# Patient Record
Sex: Female | Born: 1970 | Race: White | Hispanic: No | Marital: Married | Smoking: Never smoker
Health system: Southern US, Community
[De-identification: ages and names within clinical notes are randomized; demographics above are authoritative.]

## PROBLEM LIST (undated history)

## (undated) DIAGNOSIS — F32A Depression, unspecified: Secondary | ICD-10-CM

## (undated) DIAGNOSIS — I10 Essential (primary) hypertension: Secondary | ICD-10-CM

## (undated) DIAGNOSIS — Z803 Family history of malignant neoplasm of breast: Secondary | ICD-10-CM

## (undated) DIAGNOSIS — T7840XA Allergy, unspecified, initial encounter: Secondary | ICD-10-CM

## (undated) DIAGNOSIS — J45909 Unspecified asthma, uncomplicated: Secondary | ICD-10-CM

## (undated) DIAGNOSIS — Z9189 Other specified personal risk factors, not elsewhere classified: Secondary | ICD-10-CM

## (undated) DIAGNOSIS — F329 Major depressive disorder, single episode, unspecified: Secondary | ICD-10-CM

## (undated) DIAGNOSIS — Z8041 Family history of malignant neoplasm of ovary: Secondary | ICD-10-CM

## (undated) DIAGNOSIS — M722 Plantar fascial fibromatosis: Secondary | ICD-10-CM

## (undated) DIAGNOSIS — H409 Unspecified glaucoma: Secondary | ICD-10-CM

## (undated) DIAGNOSIS — Z1371 Encounter for nonprocreative screening for genetic disease carrier status: Secondary | ICD-10-CM

## (undated) HISTORY — DX: Family history of malignant neoplasm of ovary: Z80.41

## (undated) HISTORY — DX: Encounter for nonprocreative screening for genetic disease carrier status: Z13.71

## (undated) HISTORY — DX: Plantar fascial fibromatosis: M72.2

## (undated) HISTORY — DX: Essential (primary) hypertension: I10

## (undated) HISTORY — DX: Major depressive disorder, single episode, unspecified: F32.9

## (undated) HISTORY — PX: LYMPH NODE BIOPSY: SHX201

## (undated) HISTORY — DX: Depression, unspecified: F32.A

## (undated) HISTORY — DX: Unspecified asthma, uncomplicated: J45.909

## (undated) HISTORY — DX: Family history of malignant neoplasm of breast: Z80.3

## (undated) HISTORY — DX: Allergy, unspecified, initial encounter: T78.40XA

## (undated) HISTORY — DX: Unspecified glaucoma: H40.9

## (undated) HISTORY — DX: Other specified personal risk factors, not elsewhere classified: Z91.89

---

## 2001-05-20 ENCOUNTER — Encounter: Payer: Self-pay | Admitting: Otolaryngology

## 2001-05-20 ENCOUNTER — Encounter: Admission: RE | Admit: 2001-05-20 | Discharge: 2001-05-20 | Payer: Self-pay | Admitting: Otolaryngology

## 2007-02-17 ENCOUNTER — Ambulatory Visit: Payer: Self-pay | Admitting: Obstetrics & Gynecology

## 2007-02-23 ENCOUNTER — Ambulatory Visit: Payer: Self-pay | Admitting: Obstetrics & Gynecology

## 2008-02-23 ENCOUNTER — Ambulatory Visit: Payer: Self-pay | Admitting: Obstetrics & Gynecology

## 2010-12-21 DIAGNOSIS — Z1371 Encounter for nonprocreative screening for genetic disease carrier status: Secondary | ICD-10-CM

## 2010-12-21 HISTORY — DX: Encounter for nonprocreative screening for genetic disease carrier status: Z13.71

## 2011-07-21 ENCOUNTER — Ambulatory Visit: Payer: Self-pay | Admitting: Obstetrics & Gynecology

## 2015-01-23 ENCOUNTER — Ambulatory Visit: Payer: Self-pay | Admitting: Internal Medicine

## 2015-02-15 ENCOUNTER — Ambulatory Visit: Payer: Self-pay | Admitting: Otolaryngology

## 2015-04-15 LAB — CYTOLOGY - NON PAP

## 2015-08-29 ENCOUNTER — Other Ambulatory Visit: Payer: Self-pay | Admitting: Internal Medicine

## 2015-08-29 DIAGNOSIS — Z1231 Encounter for screening mammogram for malignant neoplasm of breast: Secondary | ICD-10-CM

## 2015-09-20 ENCOUNTER — Ambulatory Visit
Admission: RE | Admit: 2015-09-20 | Discharge: 2015-09-20 | Disposition: A | Discharge status: Home / Self Care | Payer: 59 | Source: Ambulatory Visit | Attending: Internal Medicine | Admitting: Internal Medicine

## 2015-09-20 DIAGNOSIS — Z1231 Encounter for screening mammogram for malignant neoplasm of breast: Secondary | ICD-10-CM | POA: Diagnosis present

## 2016-08-06 ENCOUNTER — Other Ambulatory Visit: Payer: Self-pay | Admitting: Obstetrics & Gynecology

## 2016-08-06 DIAGNOSIS — Z1231 Encounter for screening mammogram for malignant neoplasm of breast: Secondary | ICD-10-CM

## 2016-09-25 ENCOUNTER — Ambulatory Visit
Admission: RE | Admit: 2016-09-25 | Discharge: 2016-09-25 | Disposition: A | Discharge status: Home / Self Care | Payer: 59 | Source: Ambulatory Visit | Attending: Obstetrics & Gynecology | Admitting: Obstetrics & Gynecology

## 2016-09-25 DIAGNOSIS — Z1231 Encounter for screening mammogram for malignant neoplasm of breast: Secondary | ICD-10-CM | POA: Insufficient documentation

## 2016-11-23 ENCOUNTER — Encounter: Payer: Self-pay | Admitting: Podiatry

## 2016-11-23 ENCOUNTER — Ambulatory Visit (INDEPENDENT_AMBULATORY_CARE_PROVIDER_SITE_OTHER): Payer: 59 | Admitting: Podiatry

## 2016-11-23 DIAGNOSIS — J45909 Unspecified asthma, uncomplicated: Secondary | ICD-10-CM | POA: Insufficient documentation

## 2016-11-23 DIAGNOSIS — K219 Gastro-esophageal reflux disease without esophagitis: Secondary | ICD-10-CM | POA: Insufficient documentation

## 2016-11-23 DIAGNOSIS — L603 Nail dystrophy: Secondary | ICD-10-CM

## 2016-11-23 DIAGNOSIS — J309 Allergic rhinitis, unspecified: Secondary | ICD-10-CM | POA: Insufficient documentation

## 2016-11-23 DIAGNOSIS — R590 Localized enlarged lymph nodes: Secondary | ICD-10-CM | POA: Insufficient documentation

## 2016-11-23 NOTE — Progress Notes (Signed)
She presents today with chief complaint of discoloration to the tips of the toenails hallux bilaterally. She states that when she removed pain on there for some time she knows the tips of the toes were quite and some discoloration of the nail bed. It only seems to be occurring on the hallux nails she denies any rashes. She does relate that she has eczematous dermatitis seasonally.  Objective: Vital signs are stable alert and oriented 3 pulses are palpable. Neurologic symptoms intact. Degenerative flexor intact. Muscle strength +5 over 5 bilateral. Orthopedic evaluation of his result joints distal to the ankle range of motion without crepitation. Cutaneous evaluation Mr. is a well-hydrated cutis no erythema edema cellulitis drainage or odor. She does have discoloration with a whitening of her distal aspects of the nails. There are no tips there does appear to be some oil spots.  Assessment: Nail dystrophy cannot rule out onychomycosis without test.  Plan: Samples of the nail and skin were sent for pathologic evaluation today expecting to reveal nail dystrophy.

## 2016-12-18 ENCOUNTER — Encounter: Payer: Self-pay | Admitting: Podiatry

## 2016-12-28 ENCOUNTER — Telehealth: Payer: Self-pay | Admitting: *Deleted

## 2016-12-28 NOTE — Telephone Encounter (Addendum)
-----   Message from Garrel Ridgel, [place] sent at 12/22/2016  7:20 AM EST ----- Negative for fungus.  Chronic micro trauma. 12/28/2016-Left message informing pt to call for results.

## 2017-08-02 ENCOUNTER — Ambulatory Visit (INDEPENDENT_AMBULATORY_CARE_PROVIDER_SITE_OTHER): Payer: Managed Care, Other (non HMO) | Admitting: Podiatry

## 2017-08-02 ENCOUNTER — Ambulatory Visit (INDEPENDENT_AMBULATORY_CARE_PROVIDER_SITE_OTHER): Payer: Managed Care, Other (non HMO)

## 2017-08-02 ENCOUNTER — Encounter: Payer: Self-pay | Admitting: Podiatry

## 2017-08-02 DIAGNOSIS — M722 Plantar fascial fibromatosis: Secondary | ICD-10-CM

## 2017-08-02 MED ORDER — MELOXICAM 15 MG PO TABS: 15.0000 mg | ORAL_TABLET | Freq: Every day | ORAL | 3 refills | Status: DC

## 2017-08-02 MED ORDER — METHYLPREDNISOLONE 4 MG PO TBPK: ORAL_TABLET | ORAL | 0 refills | Status: DC

## 2017-08-02 NOTE — Patient Instructions (Addendum)

## 2017-08-02 NOTE — Progress Notes (Signed)
She presents today chief complaint of plantar heel pain right. Systematic heels and hurting for the past few months I think hiking last week just really to get over the edge.  Objective: Vital signs are stable alert and oriented 3. Pulses are palpable. Neurologic sensorium is intact. Deep tendon reflexes are intact. Muscle strength is 5 over 5 dorsiflexion plantar flexors and inverters everters on physical musculatures intact. Orthopedic evaluation strength all joints distal to the ankle range of motion without crepitation. He has pain on palpation medially located tubercle of the right heel.  Radius segment and they demonstrate plantar distally or [place] soft tissue increase in density for fascial cancer site.  Assessment: Pain limb secondary to plantar fascitis right foot.  Plan: Start her on a Medrol Dosepak to be followed by meloxicam. Injected the right heel today with Kenalog and local anesthetic. Plantar fascia brace and night splint. Discussed proper shoe yesterday size ice therapy and shoe gear modifications. Follow up with her in 1 month.

## 2017-08-20 ENCOUNTER — Other Ambulatory Visit: Payer: Self-pay | Admitting: Obstetrics & Gynecology

## 2017-08-20 DIAGNOSIS — Z1231 Encounter for screening mammogram for malignant neoplasm of breast: Secondary | ICD-10-CM

## 2017-08-30 ENCOUNTER — Ambulatory Visit: Payer: Managed Care, Other (non HMO) | Admitting: Podiatry

## 2017-09-13 ENCOUNTER — Encounter: Payer: Self-pay | Admitting: Podiatry

## 2017-09-13 ENCOUNTER — Ambulatory Visit (INDEPENDENT_AMBULATORY_CARE_PROVIDER_SITE_OTHER): Payer: Managed Care, Other (non HMO) | Admitting: Podiatry

## 2017-09-13 DIAGNOSIS — M722 Plantar fascial fibromatosis: Secondary | ICD-10-CM | POA: Diagnosis not present

## 2017-09-13 MED ORDER — DICLOFENAC SODIUM 75 MG PO TBEC: 75.0000 mg | DELAYED_RELEASE_TABLET | Freq: Two times a day (BID) | ORAL | 1 refills | Status: DC

## 2017-09-13 NOTE — Progress Notes (Signed)
She presents today for follow-up of her plantar fasciitis right heel. She states that she doesn't seem to think that the meloxicam is doing too much and she states that after about 2 weeks it started to hurt again. She states that she may not have been wearing the brace away she should've been wearing it and she deathly not wearing tennis shoes as appropriately.  Objective: Vital signs are stable she is alert and oriented 3. Pulses are palpable. She fell palpation medial calcaneal tubercle of the right heel.  Assessment: Chronic intractable plantar fascitis right foot.  Plan: Injected the right heel once again today switched her from meloxicam to diclofenac 75 mg 1 by mouth twice a day I will follow up with her in 1 month may need to consider orthotics.

## 2017-09-27 ENCOUNTER — Ambulatory Visit
Admission: RE | Admit: 2017-09-27 | Discharge: 2017-09-27 | Disposition: A | Discharge status: Home / Self Care | Payer: Managed Care, Other (non HMO) | Source: Ambulatory Visit | Attending: Obstetrics & Gynecology | Admitting: Obstetrics & Gynecology

## 2017-09-27 ENCOUNTER — Encounter: Payer: Self-pay | Admitting: Obstetrics & Gynecology

## 2017-09-27 DIAGNOSIS — Z1231 Encounter for screening mammogram for malignant neoplasm of breast: Secondary | ICD-10-CM | POA: Insufficient documentation

## 2017-10-06 ENCOUNTER — Other Ambulatory Visit: Payer: Self-pay | Admitting: *Deleted

## 2017-10-06 MED ORDER — MELOXICAM 15 MG PO TABS: 15.0000 mg | ORAL_TABLET | Freq: Every day | ORAL | 1 refills | Status: DC

## 2017-10-11 ENCOUNTER — Ambulatory Visit: Payer: Managed Care, Other (non HMO) | Admitting: Podiatry

## 2017-11-15 ENCOUNTER — Ambulatory Visit: Payer: Managed Care, Other (non HMO) | Admitting: Obstetrics and Gynecology

## 2017-12-08 ENCOUNTER — Ambulatory Visit: Payer: Managed Care, Other (non HMO) | Admitting: Obstetrics and Gynecology

## 2018-01-05 ENCOUNTER — Ambulatory Visit: Payer: Managed Care, Other (non HMO) | Admitting: Obstetrics and Gynecology

## 2018-01-07 ENCOUNTER — Other Ambulatory Visit: Payer: Self-pay | Admitting: Obstetrics & Gynecology

## 2018-01-21 DIAGNOSIS — Z9189 Other specified personal risk factors, not elsewhere classified: Secondary | ICD-10-CM

## 2018-01-21 HISTORY — DX: Other specified personal risk factors, not elsewhere classified: Z91.89

## 2018-01-28 ENCOUNTER — Encounter: Payer: Self-pay | Admitting: Obstetrics and Gynecology

## 2018-01-28 ENCOUNTER — Ambulatory Visit (INDEPENDENT_AMBULATORY_CARE_PROVIDER_SITE_OTHER): Payer: Managed Care, Other (non HMO) | Admitting: Obstetrics and Gynecology

## 2018-01-28 VITALS — BP 134/90 | HR 78 | Ht 66.0 in | Wt 177.0 lb

## 2018-01-28 DIAGNOSIS — Z01419 Encounter for gynecological examination (general) (routine) without abnormal findings: Secondary | ICD-10-CM

## 2018-01-28 DIAGNOSIS — Z803 Family history of malignant neoplasm of breast: Secondary | ICD-10-CM

## 2018-01-28 DIAGNOSIS — Z1321 Encounter for screening for nutritional disorder: Secondary | ICD-10-CM

## 2018-01-28 DIAGNOSIS — Z3041 Encounter for surveillance of contraceptive pills: Secondary | ICD-10-CM | POA: Diagnosis not present

## 2018-01-28 DIAGNOSIS — Z1239 Encounter for other screening for malignant neoplasm of breast: Secondary | ICD-10-CM

## 2018-01-28 DIAGNOSIS — Z1231 Encounter for screening mammogram for malignant neoplasm of breast: Secondary | ICD-10-CM

## 2018-01-28 MED ORDER — LEVONORGEST-ETH ESTRAD 91-DAY 0.15-0.03 &0.01 MG PO TABS: 1.0000 | ORAL_TABLET | Freq: Every day | ORAL | 3 refills | Status: DC

## 2018-01-28 NOTE — Progress Notes (Signed)
PCP:  Adin Hector, MD   Chief Complaint  Patient presents with  . Gynecologic Exam    curiosity about be middle aged women, night sweats and such     HPI:      [name] is a 47 y.o. No obstetric history on file. who LMP was No LMP recorded (lmp unknown)., presents today for her annual examination.  Her menses are mostly absent with OCPs, occas spotting. Dysmenorrhea none. She does have occas night sweats.  Sex activity: not sexually active--husband with current health issues.  Last Pap: November 09, 2016  Results were: no abnormalities /neg HPV DNA  Hx of STDs: none  Last mammogram: 09/27/17  Results were: normal--routine follow-up in 12 months There is a FH of breast cancer in her mom and pat aunt. There is a FH of ovarian cancer in her pat aunt, and colon cancer in her pat uncle. The patient does do self-breast exams. BRCA neg 6/12. No update testing done. No Vit D supp.  Tobacco use: The patient denies current or previous tobacco use. Alcohol use: none No drug use.  Exercise: moderately active  She does get adequate calcium but not Vitamin D in her diet.  Labs with PCP   Past Medical History:  Diagnosis Date  . Asthma   . BRCA negative 2012  . Family history of breast cancer   . Family history of ovarian cancer    6/12 BRCA Neg  . Plantar fasciitis     No past surgical history on file.  Family History  Problem Relation Age of Onset  . Breast cancer Mother 27  . Breast cancer Paternal Aunt 49  . Colon cancer Paternal Uncle 11  . Ovarian cancer Paternal Aunt 45    Social History   Socioeconomic History  . Marital status: Married    Spouse name: Not on file  . Number of children: Not on file  . Years of education: Not on file  . Highest education level: Not on file  Social Needs  . Financial resource strain: Not on file  . Food insecurity - worry: Not on file  . Food insecurity - inability: Not on file  . Transportation needs -  medical: Not on file  . Transportation needs - non-medical: Not on file  Occupational History  . Not on file  Tobacco Use  . Smoking status: Not on file  . Smokeless tobacco: Never Used  Substance and Sexual Activity  . Alcohol use: No    Frequency: Never  . Drug use: No  . Sexual activity: No    Birth control/protection: Pill  Other Topics Concern  . Not on file  Social History Narrative  . Not on file    Current Meds  Medication Sig  . ADVAIR DISKUS 250-50 MCG/DOSE AEPB   . albuterol (PROVENTIL HFA;VENTOLIN HFA) 108 (90 Base) MCG/ACT inhaler Inhale into the lungs.  . cetirizine (ZYRTEC ALLERGY) 10 MG tablet Take 1 tablet by mouth daily.  . Fluticasone-Salmeterol (ADVAIR DISKUS) 250-50 MCG/DOSE AEPB Inhale into the lungs.  . Levonorgestrel-Ethinyl Estradiol (DAYSEE) 0.15-0.03 &0.01 MG tablet Take 1 tablet by mouth daily.  . montelukast (SINGULAIR) 10 MG tablet   . [DISCONTINUED] Levonorgestrel-Ethinyl Estradiol (DAYSEE) 0.15-0.03 &0.01 MG tablet Take 1 tablet by mouth daily.     ROS:  Review of Systems  Constitutional: Negative for fatigue, fever and unexpected weight change.  Respiratory: Negative for cough, shortness of breath and wheezing.   Cardiovascular: Negative for  chest pain, palpitations and leg swelling.  Gastrointestinal: Negative for blood in stool, constipation, diarrhea, nausea and vomiting.  Endocrine: Negative for cold intolerance, heat intolerance and polyuria.  Genitourinary: Negative for dyspareunia, dysuria, flank pain, frequency, genital sores, hematuria, menstrual problem, pelvic pain, urgency, vaginal bleeding, vaginal discharge and vaginal pain.  Musculoskeletal: Negative for Harrell pain, joint swelling and myalgias.  Skin: Negative for rash.  Neurological: Negative for dizziness, syncope, light-headedness, numbness and headaches.  Hematological: Negative for adenopathy.  Psychiatric/Behavioral: Negative for agitation, confusion, sleep disturbance  and suicidal ideas. The patient is not nervous/anxious.      Objective: BP 134/90   Pulse 78   Ht '5\' 6"'  (1.676 m)   Wt 177 lb (80.3 kg)   LMP  (LMP Unknown) Comment: cont. bc  BMI 28.57 kg/m    Physical Exam  Constitutional: She is oriented to person, place, and time. She appears well-developed and well-nourished.  Genitourinary: Vagina normal and uterus normal. There is no rash or tenderness on the right labia. There is no rash or tenderness on the left labia. No erythema or tenderness in the vagina. No vaginal discharge found. Right adnexum does not display mass and does not display tenderness. Left adnexum does not display mass and does not display tenderness. Cervix does not exhibit motion tenderness or polyp. Uterus is not enlarged or tender.  Neck: Normal range of motion. No thyromegaly present.  Cardiovascular: Normal rate, regular rhythm and normal heart sounds.  No murmur heard. Pulmonary/Chest: Effort normal and breath sounds normal. Right breast exhibits no mass, no nipple discharge, no skin change and no tenderness. Left breast exhibits no mass, no nipple discharge, no skin change and no tenderness.  Abdominal: Soft. There is no tenderness. There is no guarding.  Musculoskeletal: Normal range of motion.  Neurological: She is alert and oriented to person, place, and time. No cranial nerve deficit.  Psychiatric: She has a normal mood and affect. Her behavior is normal.  Vitals reviewed.   Assessment/Plan: Encounter for annual routine gynecological examination  Screening for breast cancer - Pt to sched mammo 10/19 - Plan: MM SCREENING BREAST TOMO BILATERAL  Family history of breast cancer - MyRisk update testing discussed and done today. Will call pt with results since [employer]. - Plan: MM SCREENING BREAST TOMO BILATERAL, Integrated BRACAnalysis ([place])  Encounter for surveillance of contraceptive pills - OCP RF.  - Plan: Levonorgestrel-Ethinyl Estradiol  (DAYSEE) 0.15-0.03 &0.01 MG tablet  Encounter for vitamin deficiency screening - Plan: VITAMIN D 25 Hydroxy (Vit-D Deficiency, Fractures)  Meds ordered this encounter  Medications  . Levonorgestrel-Ethinyl Estradiol (DAYSEE) 0.15-0.03 &0.01 MG tablet    Sig: Take 1 tablet by mouth daily.    Dispense:  84 tablet    Refill:  3    Order Specific Question:   Supervising Provider    Answer:   Gae Dry [[id number]]             GYN counsel breast self exam, mammography screening, menopause, adequate intake of calcium and vitamin D, diet and exercise     F/U  Return in about 1 year (around 01/28/2019).  Alicia B. Copland, PA-C 01/28/2018 9:10 AM

## 2018-01-28 NOTE — Patient Instructions (Signed)
I value your feedback and entrusting us with your care. If you get a  patient survey, I would appreciate you taking the time to let us know about your experience today. Thank you! 

## 2018-01-29 LAB — VITAMIN D 25 HYDROXY (VIT D DEFICIENCY, FRACTURES): VIT D 25 HYDROXY: 18.9 ng/mL — AB (ref 30.0–100.0)

## 2018-02-07 ENCOUNTER — Encounter: Payer: Self-pay | Admitting: Obstetrics and Gynecology

## 2018-03-01 ENCOUNTER — Telehealth: Payer: Self-pay | Admitting: Obstetrics and Gynecology

## 2018-03-01 NOTE — Telephone Encounter (Signed)
Discussed MyRisk resutls. Pt already talked with GC, Stefanie Libel, due to [employer]. Pt's riskscore=34.5%/IBIS=21%. Pt's last mammo 10/18 was normal. Doing SBE, Vit D supp. Had CBE 2/19.  Discussed monthly SBE, Q6-12 month CBE, yearly mammos and scr br MRI. Pt can't afford MRI this yr but will consider next yr. Will do mammo 10/19 and f/u for 2/20 annual. Will re-discuss scr breast MRI at that time.  Pt's questions answered. She has hard copy of results through [facility]. F/u prn.

## 2018-08-09 LAB — LIPID PANEL
Cholesterol: 210 — AB (ref 0–200)
HDL: 50 (ref 35–70)
LDL CALC: 134
Triglycerides: 131 (ref 40–160)

## 2018-08-09 LAB — BASIC METABOLIC PANEL: GLUCOSE: 90

## 2018-08-09 LAB — HEMOGLOBIN A1C: Hemoglobin A1C: 5.6

## 2018-09-09 ENCOUNTER — Encounter: Payer: Self-pay | Admitting: Family Medicine

## 2018-09-09 ENCOUNTER — Ambulatory Visit: Payer: Managed Care, Other (non HMO) | Admitting: Family Medicine

## 2018-09-09 VITALS — BP 142/80 | HR 82 | Temp 98.3°F | Resp 16 | Ht 66.0 in | Wt 176.0 lb

## 2018-09-09 DIAGNOSIS — R5383 Other fatigue: Secondary | ICD-10-CM

## 2018-09-09 DIAGNOSIS — R030 Elevated blood-pressure reading, without diagnosis of hypertension: Secondary | ICD-10-CM | POA: Diagnosis not present

## 2018-09-09 DIAGNOSIS — Z23 Encounter for immunization: Secondary | ICD-10-CM | POA: Diagnosis not present

## 2018-09-09 DIAGNOSIS — Z7689 Persons encountering health services in other specified circumstances: Secondary | ICD-10-CM | POA: Diagnosis not present

## 2018-09-09 NOTE — Progress Notes (Signed)
Subjective:    Patient ID: Nicole Harrell, female    DOB: May 24, 1971, 47 y.o.   MRN: [id number]  HPI This is a 47 yo female who presents today to establish care. Previously seen by Dr. Caryl Comes at [facility]. Also sees Ardeth Perfect, [place] for gynecologic care.  Her mother passed (age 40) away from unknown cause 3/19. She is now taking care of her special needs brother (he recently moved to a supervised apartment). Mother with history of asthma. ? Early lung cancer.   Lives with husband and 18 yo daughter. She works for [employer] for 24 years. Some stress with work. Has gained weight, doesn't feel as good.   Has had genetic screening with genetic counselor due to family history. She is interested in modifiable risk factors.   Last CPE- gyn 2/19 Mammo-09/27/17 Pap- 11/09/16, negative, negative HPV Tdap- 01/09/15 Flu- annual Exercise- really enjoys yoga, little time for exercise lately Sleep- 6-7 hours during work week  Achy, especially feet and legs. More sedentary lately. Has history of plantar fasciitis, this feels different.   Elevated blood pressure reading. Had at work biometric screening.   Past Medical History:  Diagnosis Date  . Asthma   . BRCA negative 2012, 2/19   BRCA neg 2012; MyRIsk neg 2019  . Depression   . Family history of breast cancer   . Family history of ovarian cancer    6/12 BRCA Neg  . Increased risk of breast cancer 01/2018   IBIS=21%/riskscore=34.5%  . Plantar fasciitis    Past Surgical History:  Procedure Laterality Date  . LYMPH NODE BIOPSY     Family History  Problem Relation Age of Onset  . Breast cancer Mother 36  . Breast cancer Paternal Aunt 55  . Colon cancer Paternal Uncle 65  . Ovarian cancer Paternal Aunt 21   Social History   Tobacco Use  . Smoking status: Never Smoker  . Smokeless tobacco: Never Used  Substance Use Topics  . Alcohol use: No    Frequency: Never  . Drug use: No     Review of Systems Per HPI      Objective:   Physical Exam Physical Exam  Vitals reviewed. Constitutional: Oriented to person, place, and time. Appears well-developed and well-nourished.  HENT:  Head: Normocephalic and atraumatic.  Eyes: Conjunctivae are normal.  Neck: Normal range of motion. Neck supple.  Cardiovascular: Normal rate.   Pulmonary/Chest: Effort normal.  Musculoskeletal: Normal range of motion.  Neurological: Alert and oriented to person, place, and time.  Skin: Skin is warm and dry.  Psychiatric: Normal mood and affect. Behavior is normal. Judgment and thought content normal.      BP (!) 142/80 (BP Location: Left Arm, Patient Position: Sitting, Cuff Size: Normal)   Pulse 82   Temp 98.3 F (36.8 C) (Oral)   Resp 16   Ht '5\' 6"'  (1.676 m)   Wt 176 lb (79.8 kg)   SpO2 98%   BMI 28.41 kg/m  Wt Readings from Last 3 Encounters:  09/09/18 176 lb (79.8 kg)  01/28/18 177 lb (80.3 kg)   BP Readings from Last 3 Encounters:  09/09/18 (!) 142/80  01/28/18 134/90       Assessment & Plan:  1. Encounter to establish care -- Discussed and encouraged healthy lifestyle choices- adequate sleep, regular exercise, stress management and healthy food choices.   2. Elevated blood pressure reading - TSH - mildly elevated today in office, she is very motivated to work  on diet, provided information about Med Diet  3. Fatigue, unspecified type - multiple increased stressors, decreased time for self care - encouraged her to work on sleep quality/quantity, stress management, healthy food choices  4. Need for influenza vaccination - Flu Vaccine QUAD 6+ mos PF IM (Fluarix Quad PF)   Clarene Reamer, FNP-BC  [facility], [facility]  09/09/2018 9:15 PM

## 2018-09-09 NOTE — Patient Instructions (Addendum)
Yoga resources- You tube [place] [employer] on Demand  Follow up in 3 months     [place] refers to food and lifestyle choices that are based on the traditions of countries located on the [employer]. This way of eating has been shown to help prevent certain conditions and improve outcomes for people who have chronic diseases, like kidney disease and heart disease. What are tips for following this plan? Lifestyle  Cook and eat meals together with your family, when possible.  Drink enough fluid to keep your urine clear or pale yellow.  Be physically active every day. This includes: ? Aerobic exercise like running or swimming. ? Leisure activities like gardening, walking, or housework.  Get 7-8 hours of sleep each night.  If recommended by your health care provider, drink red wine in moderation. This means 1 glass a day for nonpregnant women and 2 glasses a day for men. A glass of wine equals 5 oz (150 mL). Reading food labels  Check the serving size of packaged foods. For foods such as rice and pasta, the serving size refers to the amount of cooked product, not dry.  Check the total fat in packaged foods. Avoid foods that have saturated fat or trans fats.  Check the ingredients list for added sugars, such as corn syrup. Shopping  At the grocery store, buy most of your food from the areas near the walls of the store. This includes: ? Fresh fruits and vegetables (produce). ? Grains, beans, nuts, and seeds. Some of these may be available in unpackaged forms or large amounts (in bulk). ? Fresh seafood. ? Poultry and eggs. ? Low-fat dairy products.  Buy whole ingredients instead of prepackaged foods.  Buy fresh fruits and vegetables in-season from local farmers markets.  Buy frozen fruits and vegetables in resealable bags.  If you do not have access to quality fresh seafood, buy precooked frozen shrimp or canned fish, such as tuna,  salmon, or sardines.  Buy small amounts of raw or cooked vegetables, salads, or olives from the deli or salad bar at your store.  Stock your pantry so you always have certain foods on hand, such as olive oil, canned tuna, canned tomatoes, rice, pasta, and beans. Cooking  Cook foods with extra-virgin olive oil instead of using butter or other vegetable oils.  Have meat as a side dish, and have vegetables or grains as your main dish. This means having meat in small portions or adding small amounts of meat to foods like pasta or stew.  Use beans or vegetables instead of meat in common dishes like chili or lasagna.  Experiment with different cooking methods. Try roasting or broiling vegetables instead of steaming or sauteing them.  Add frozen vegetables to soups, stews, pasta, or rice.  Add nuts or seeds for added healthy fat at each meal. You can add these to yogurt, salads, or vegetable dishes.  Marinate fish or vegetables using olive oil, lemon juice, garlic, and fresh herbs. Meal planning  Plan to eat 1 vegetarian meal one day each week. Try to work up to 2 vegetarian meals, if possible.  Eat seafood 2 or more times a week.  Have healthy snacks readily available, such as: ? Vegetable sticks with hummus. ? [place] yogurt. ? Fruit and nut trail mix.  Eat balanced meals throughout the week. This includes: ? Fruit: 2-3 servings a day ? Vegetables: 4-5 servings a day ? Low-fat dairy: 2 servings a day ? Fish,  poultry, or lean meat: 1 serving a day ? Beans and legumes: 2 or more servings a week ? Nuts and seeds: 1-2 servings a day ? Whole grains: 6-8 servings a day ? Extra-virgin olive oil: 3-4 servings a day  Limit red meat and sweets to only a few servings a month What are my food choices?  Mediterranean diet ? Recommended ? Grains: Whole-grain pasta. Brown rice. Bulgar wheat. Polenta. Couscous. Whole-wheat bread. [place]. ? Vegetables: Artichokes. Beets. Broccoli.  Cabbage. Carrots. Eggplant. Green beans. Chard. Kale. Spinach. Onions. Leeks. Peas. Squash. Tomatoes. Peppers. Radishes. ? Fruits: Apples. Apricots. Avocado. Berries. Bananas. Cherries. Dates. Figs. Grapes. Lemons. Melon. Oranges. Peaches. Plums. Pomegranate. ? Meats and other protein foods: Beans. Almonds. Sunflower seeds. Pine nuts. Peanuts. [place]. Salmon. [place]all[place]. Shrimp. [place]. Tilapia. Clams. Oysters. Eggs. ? Dairy: Low-fat milk. Cheese. Greek yogurt. ? Beverages: Water. Red wine. Herbal tea. ? Fats and oils: Extra virgin olive oil. Avocado oil. Grape seed oil. ? Sweets and desserts: [place] yogurt with honey. Baked apples. Poached pears. Trail mix. ? Seasoning and other foods: Basil. Cilantro. Coriander. Cumin. Mint. Parsley. Sage. Rosemary. Tarragon. Garlic. Oregano. Thyme. Pepper. Balsalmic vinegar. Tahini. Hummus. Tomato sauce. Olives. Mushrooms. ? Limit these ? Grains: Prepackaged pasta or rice dishes. Prepackaged cereal with added sugar. ? Vegetables: Deep fried potatoes (french fries). ? Fruits: Fruit canned in syrup. ? Meats and other protein foods: Beef. Pork. Lamb. Poultry with skin. Hot dogs. [place] Salines. ? Dairy: Ice cream. Sour cream. Whole milk. ? Beverages: Juice. Sugar-sweetened soft drinks. Beer. Liquor and spirits. ? Fats and oils: Butter. Canola oil. Vegetable oil. Beef fat (tallow). Lard. ? Sweets and desserts: Cookies. Cakes. Pies. Candy. ? Seasoning and other foods: Mayonnaise. Premade sauces and marinades. ? The items listed may not be a complete list. Talk with your dietitian about what dietary choices are right for you. Summary  The Mediterranean diet includes both food and lifestyle choices.  Eat a variety of fresh fruits and vegetables, beans, nuts, seeds, and whole grains.  Limit the amount of red meat and sweets that you eat.  Talk with your health care provider about whether it is safe for you to drink red wine in moderation. This means 1 glass a day for  nonpregnant women and 2 glasses a day for men. A glass of wine equals 5 oz (150 mL). This information is not intended to replace advice given to you by your health care provider. Make sure you discuss any questions you have with your health care provider. Document Released: 07/30/2016 Document Revised: 09/01/2016 Document Reviewed: 07/30/2016 Elsevier Interactive Patient Education  [employer].

## 2018-09-10 LAB — TSH: TSH: 1.23 u[IU]/mL (ref 0.450–4.500)

## 2018-09-29 ENCOUNTER — Ambulatory Visit
Admission: RE | Admit: 2018-09-29 | Discharge: 2018-09-29 | Disposition: A | Discharge status: Home / Self Care | Payer: Managed Care, Other (non HMO) | Source: Ambulatory Visit | Attending: Obstetrics and Gynecology | Admitting: Obstetrics and Gynecology

## 2018-09-29 DIAGNOSIS — Z803 Family history of malignant neoplasm of breast: Secondary | ICD-10-CM | POA: Diagnosis present

## 2018-09-29 DIAGNOSIS — Z1239 Encounter for other screening for malignant neoplasm of breast: Secondary | ICD-10-CM | POA: Insufficient documentation

## 2018-09-30 ENCOUNTER — Encounter: Payer: Self-pay | Admitting: Obstetrics and Gynecology

## 2018-11-23 ENCOUNTER — Encounter: Payer: Self-pay | Admitting: Family Medicine

## 2018-11-28 ENCOUNTER — Other Ambulatory Visit: Payer: Self-pay | Admitting: Family Medicine

## 2018-11-28 DIAGNOSIS — E663 Overweight: Secondary | ICD-10-CM

## 2018-12-09 ENCOUNTER — Ambulatory Visit: Payer: Managed Care, Other (non HMO) | Admitting: Family Medicine

## 2018-12-13 ENCOUNTER — Encounter: Payer: Managed Care, Other (non HMO) | Attending: Family Medicine | Admitting: Dietician

## 2018-12-13 ENCOUNTER — Encounter: Payer: Self-pay | Admitting: Dietician

## 2018-12-13 VITALS — Ht 66.0 in | Wt 178.4 lb

## 2018-12-13 DIAGNOSIS — Z6828 Body mass index (BMI) 28.0-28.9, adult: Secondary | ICD-10-CM | POA: Insufficient documentation

## 2018-12-13 DIAGNOSIS — Z713 Dietary counseling and surveillance: Secondary | ICD-10-CM | POA: Insufficient documentation

## 2018-12-13 DIAGNOSIS — E663 Overweight: Secondary | ICD-10-CM | POA: Insufficient documentation

## 2018-12-13 NOTE — Patient Instructions (Signed)
   Begin some regular exercise, starting with light intensity and/or short duration and gradually build up such as walking 3x a week and yoga once a week.   Measure food portions, especially starches, and reduce as needed.   Plan for balanced meals, including generous portions of low-carb veggies, lean proteins, and small-moderate portions of starches, often whole grain versions,.  Use an app like MyFitnessPal to track food intake and activity.

## 2018-12-13 NOTE — Progress Notes (Signed)
Medical Nutrition Therapy: Visit start time: 0900  end time: 1010 Assessment:  Diagnosis: overweight Past medical history: GERD Psychosocial issues/ stress concerns: high stress level; patient feels she is dealing "Ok" with stress  Preferred learning method:  . Auditory . Visual . 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 She has worked on healthy lifestyle habits in the past -- tried [employer], also incorporated exercise (running, free weights) to maintain healthy weight. She seeks help in working on healthy and balanced eating and fitness, wants to avoid fad diets; voices confusion with varying diet trends and consumer health information.    Physical activity: none at this time; plans to resume  Dietary Intake:  Usual eating pattern includes 3 meals and 1-2 snacks per day. Dining out frequency: 3-4 meals per week.  Breakfast: smoothie with almond milk, homey, juice plus protein powder, occ dark choc chips or cherries + whole grain toast with nuts or oatmeal. Drinks tea with stevia Snack: granola bar; chips, cheese-its Lunch: salad with protien ie chicken at least once daily with balsamic vin or ranch. Doesn't like soft textured fruits with seeds, does like apples, pears. Sometimes protein snack pack Snack: same as am Supper: daughter likes pastas (lemon, alfredo); often on-the-go eating mikonos, la fiesta Snack: none Beverages: water, tea somteimes forgets to drink when busy  Nutrition Care Education: Topics covered: weight control Basic nutrition: basic food groups, appropriate nutrient balance, appropriate meal and snack schedule, general nutrition guidelines    Weight control: benefits of weight control, plate method for planning balanced meals,  DASH and Mediterranean diets/ eating patterns, appropriate food portions and balance for 1300kcal intake for weight loss; disadvantages of popular diets; sustaining healthy eating pattern; role of physical activity; benefits of tracking food intake and activity  Nutritional Diagnosis:  [place]-3.3 Overweight/obesity As related to excess calories, inactivity, stress.  As evidenced by patient with current BMI of 28.8, and patient report of diet and activity history.  Intervention:   Instruction as noted above.  Patient is motivated to resume healthy eating pattern and has begun making some changes.  Encouraged gradual changes, a few at a time, to make long-lasting healthy habits  Set goals with direction from patient.  Patient does not feel she needs follow-up at this time, but will schedule later if needed.  Education Materials given:  . Plate Planner with food lists . Sample meal pattern/ menus . DASH diet overview . Goals/ instructions   Learner/ who was taught:  . Patient    Level of understanding: Marland Kitchen Verbalizes/ demonstrates competency   Demonstrated degree of understanding via:   Teach back Learning barriers: . None  Willingness to learn/ readiness for change: . Acceptance, ready for change  Monitoring and Evaluation:  Dietary intake, exercise, and body weight      follow up: prn

## 2018-12-22 ENCOUNTER — Telehealth: Payer: Managed Care, Other (non HMO) | Admitting: Family

## 2018-12-22 DIAGNOSIS — R059 Cough, unspecified: Secondary | ICD-10-CM

## 2018-12-22 DIAGNOSIS — R05 Cough: Secondary | ICD-10-CM

## 2018-12-22 DIAGNOSIS — B9789 Other viral agents as the cause of diseases classified elsewhere: Secondary | ICD-10-CM

## 2018-12-22 DIAGNOSIS — J329 Chronic sinusitis, unspecified: Secondary | ICD-10-CM

## 2018-12-22 MED ORDER — ALBUTEROL SULFATE HFA 108 (90 BASE) MCG/ACT IN AERS: 2.0000 | INHALATION_SPRAY | Freq: Four times a day (QID) | RESPIRATORY_TRACT | 2 refills | Status: DC | PRN

## 2018-12-22 MED ORDER — FLUTICASONE PROPIONATE 50 MCG/ACT NA SUSP: 1.0000 | Freq: Two times a day (BID) | NASAL | 6 refills | Status: DC

## 2018-12-22 MED ORDER — BENZONATATE 100 MG PO CAPS: 100.0000 mg | ORAL_CAPSULE | Freq: Three times a day (TID) | ORAL | 0 refills | Status: DC | PRN

## 2018-12-22 NOTE — Progress Notes (Signed)
Thank you for the details you included in the comment boxes. Those details are very helpful in determining the best course of treatment for you and help [place] to provide the best care. See plan below. At 3 days, it is too early to consider antibiotics as nearly 90% of these infections are viral. I refilled your inhaler and added Tessalon Perles 100mg  also; take 1-2 gel caps every 8 hours as needed for cough. See more details below this line.  We are sorry that you are not feeling well.  Here is how we plan to help!  Based on what you have shared with me it looks like you have sinusitis.  Sinusitis is inflammation and infection in the sinus cavities of the head.  Based on your presentation I believe you most likely have Acute Viral Sinusitis.This is an infection most likely caused by a virus. There is not specific treatment for viral sinusitis other than to help you with the symptoms until the infection runs its course.  You may use an oral decongestant such as Mucinex D or if you have glaucoma or high blood pressure use plain Mucinex. Saline nasal spray help and can safely be used as often as needed for congestion, I have prescribed: Fluticasone nasal spray one sprays in each nostril twice a day  Some authorities believe that zinc sprays or the use of Echinacea may shorten the course of your symptoms.  Sinus infections are not as easily transmitted as other respiratory infection, however we still recommend that you avoid close contact with loved ones, especially the very young and elderly.  Remember to wash your hands thoroughly throughout the day as this is the number one way to prevent the spread of infection!  Home Care:  Only take medications as instructed by your medical team.  Do not take these medications with alcohol.  A steam or ultrasonic humidifier can help congestion.  You can place a towel over your head and breathe in the steam from hot water coming from a faucet.  Avoid close contacts  especially the very young and the elderly.  Cover your mouth when you cough or sneeze.  Always remember to wash your hands.  Get Help Right Away If:  You develop worsening fever or sinus pain.  You develop a severe head ache or visual changes.  Your symptoms persist after you have completed your treatment plan.  Make sure you  Understand these instructions.  Will watch your condition.  Will get help right away if you are not doing well or get worse.  Your e-visit answers were reviewed by a board certified advanced clinical practitioner to complete your personal care plan.  Depending on the condition, your plan could have included both over the counter or prescription medications.  If there is a problem please reply  once you have received a response from your provider.  Your safety is important to [place].  If you have drug allergies check your prescription carefully.    You can use MyChart to ask questions about today's visit, request a non-urgent call back, or ask for a work or school excuse for 24 hours related to this e-Visit. If it has been greater than 24 hours you will need to follow up with your provider, or enter a new e-Visit to address those concerns.  You will get an e-mail in the next two days asking about your experience.  I hope that your e-visit has been valuable and will speed your recovery. Thank you for  using e-visits.

## 2019-01-06 ENCOUNTER — Ambulatory Visit: Payer: Managed Care, Other (non HMO) | Admitting: Family Medicine

## 2019-01-30 ENCOUNTER — Ambulatory Visit: Payer: Managed Care, Other (non HMO) | Admitting: Obstetrics and Gynecology

## 2019-02-08 ENCOUNTER — Ambulatory Visit: Payer: Managed Care, Other (non HMO) | Admitting: Obstetrics and Gynecology

## 2019-02-16 ENCOUNTER — Other Ambulatory Visit: Payer: Self-pay | Admitting: Obstetrics and Gynecology

## 2019-02-16 DIAGNOSIS — Z3041 Encounter for surveillance of contraceptive pills: Secondary | ICD-10-CM

## 2019-03-06 ENCOUNTER — Ambulatory Visit: Payer: Managed Care, Other (non HMO) | Admitting: Obstetrics and Gynecology

## 2019-03-23 ENCOUNTER — Ambulatory Visit: Payer: Managed Care, Other (non HMO) | Admitting: Obstetrics and Gynecology

## 2019-04-07 ENCOUNTER — Ambulatory Visit: Payer: Managed Care, Other (non HMO) | Admitting: Family Medicine

## 2019-04-17 ENCOUNTER — Ambulatory Visit: Payer: Managed Care, Other (non HMO) | Admitting: Obstetrics and Gynecology

## 2019-05-05 ENCOUNTER — Ambulatory Visit: Payer: Managed Care, Other (non HMO) | Admitting: Family Medicine

## 2019-05-13 ENCOUNTER — Other Ambulatory Visit: Payer: Self-pay | Admitting: Obstetrics and Gynecology

## 2019-05-13 DIAGNOSIS — Z3041 Encounter for surveillance of contraceptive pills: Secondary | ICD-10-CM

## 2019-06-05 IMAGING — MG MM DIGITAL SCREENING BILAT W/ TOMO W/ CAD
8 series · 8 of 24 positions shown · non-contrast
Comparison: Previous exam(s).

CLINICAL DATA: Screening.

EXAM:
DIGITAL SCREENING BILATERAL MAMMOGRAM WITH TOMO AND CAD

[L MLO synth-2D]
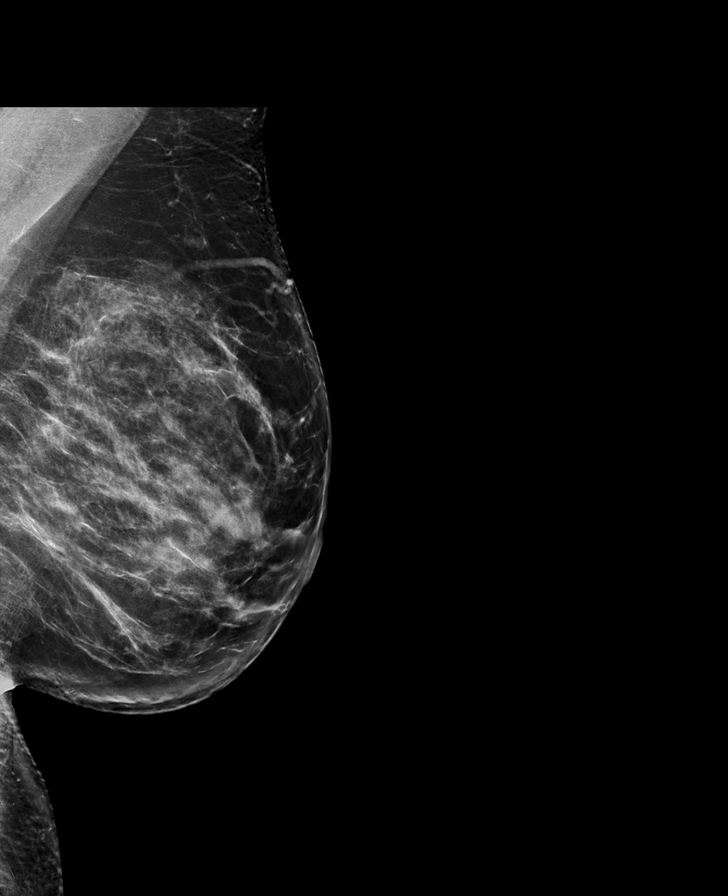

[L CC synth-2D]
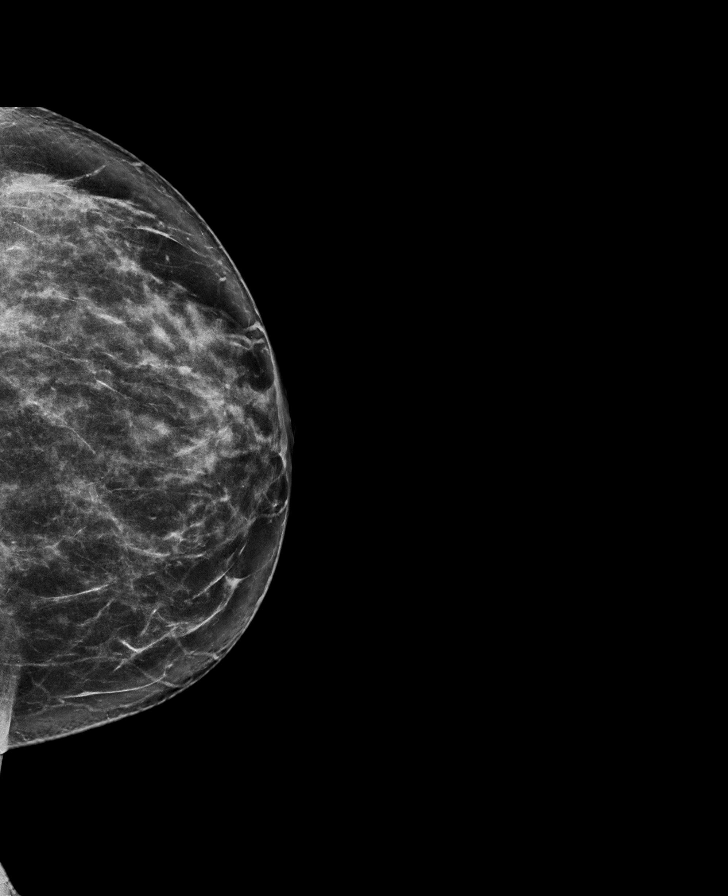

[R CC synth-2D]
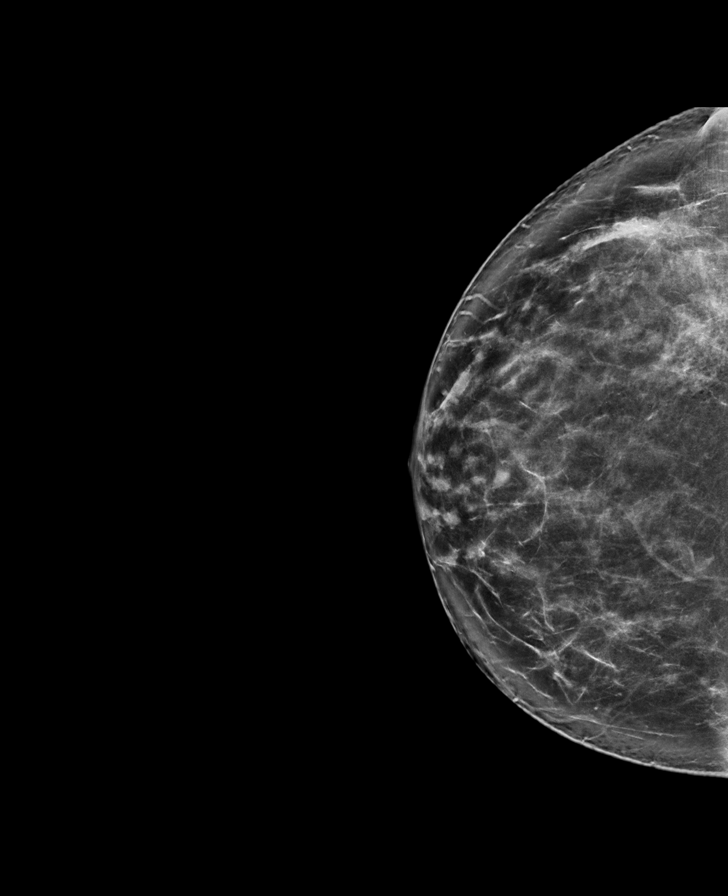

[R MLO synth-2D]
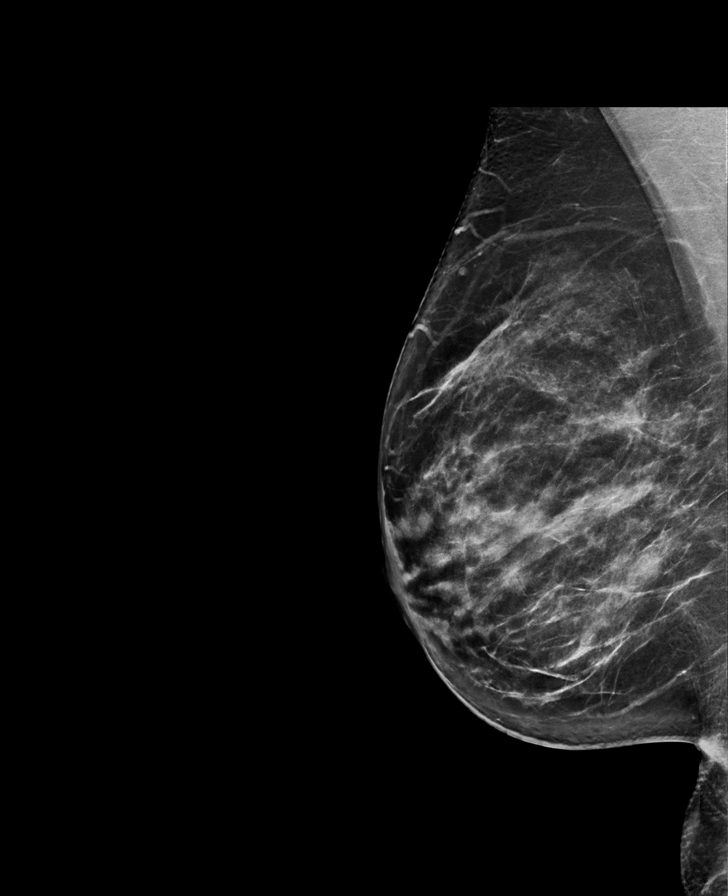

[L CC tomo · tomo slice 43/85.0]
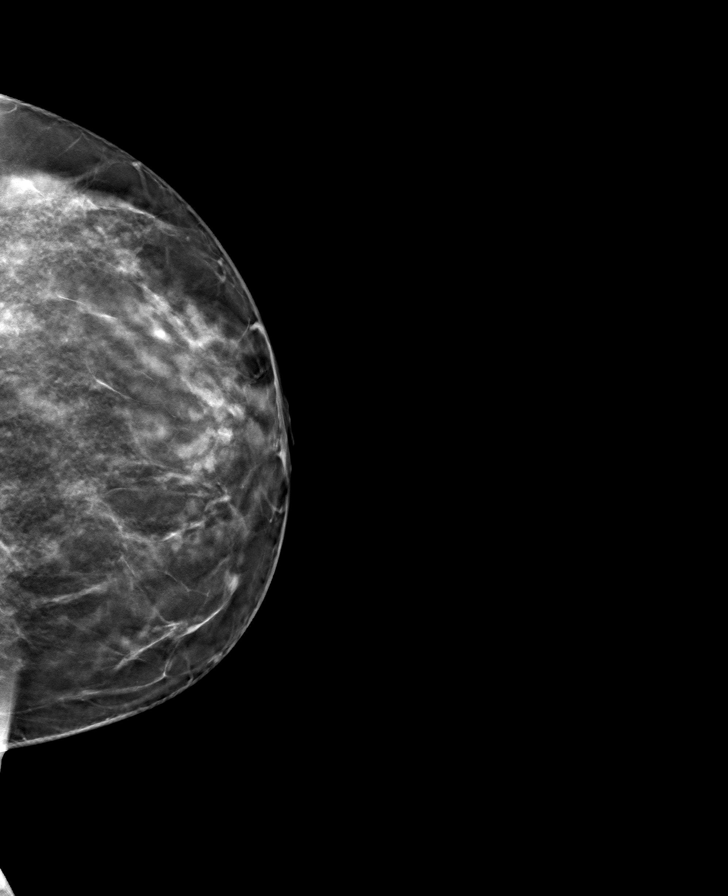

[L MLO tomo · tomo slice 47/93.0]
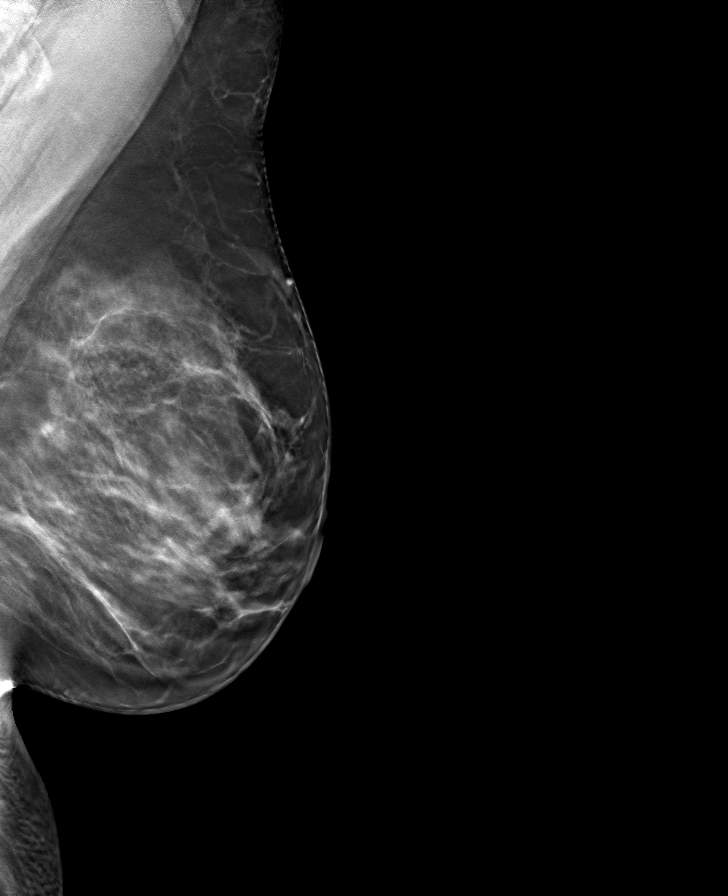

[R MLO tomo · tomo slice 47/93.0]
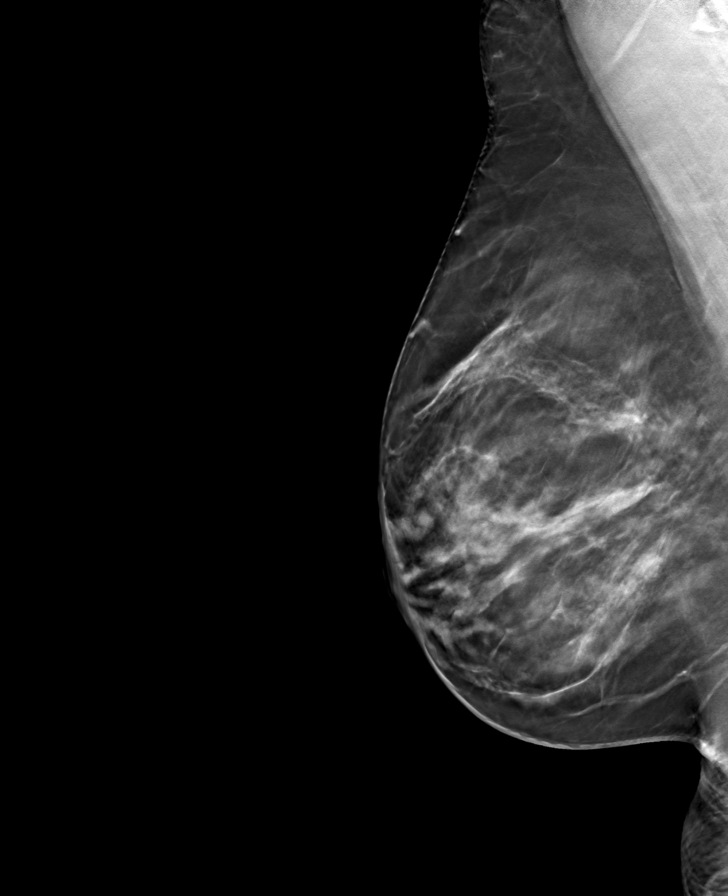

[R CC tomo · tomo slice 48/95.0]
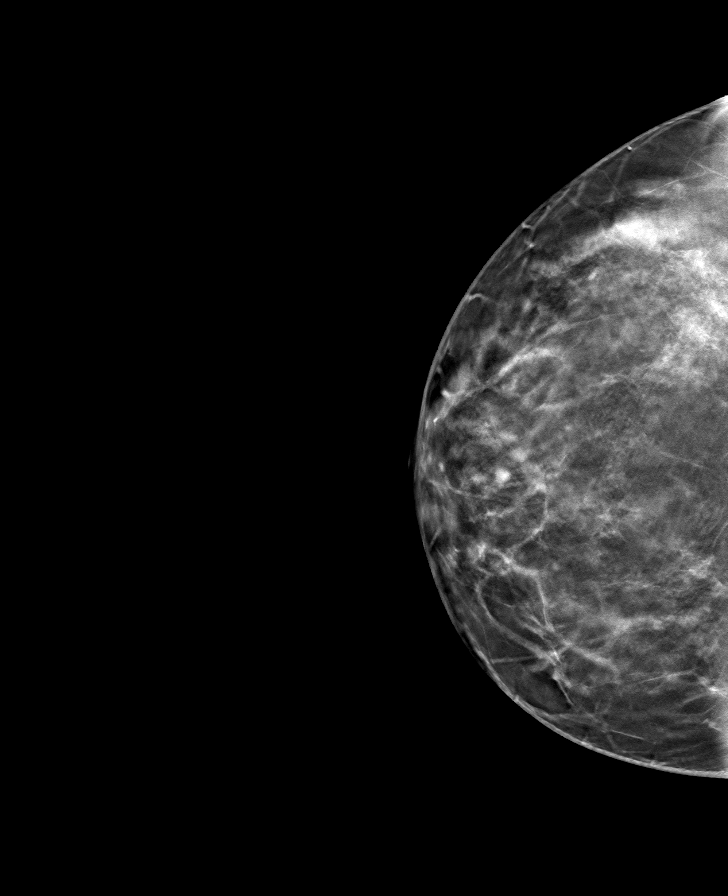

[8 of 24 positions shown; findings below may reference images not displayed]

ACR Breast Density Category c: The breast tissue is heterogeneously
dense, which may obscure small masses.
FINDINGS: There are no findings suspicious for malignancy. Images were
processed with CAD.
IMPRESSION: No mammographic evidence of malignancy. A result letter of this
screening mammogram will be mailed directly to the patient.

RECOMMENDATION:
Screening mammogram in one year. (Code:FT-[id number])

BI-RADS CATEGORY  1: Negative.

## 2019-06-14 ENCOUNTER — Ambulatory Visit: Payer: Managed Care, Other (non HMO) | Admitting: Obstetrics and Gynecology

## 2019-06-20 NOTE — Progress Notes (Signed)
PCP:  Elby Beck, FNP   Chief Complaint  Patient presents with  . Gynecologic Exam  . [employer] Employee     HPI:      [name] is a 48 y.o. No obstetric history on file. who LMP was No LMP recorded. (Menstrual status: Oral contraceptives)., presents today for her annual examination.  Her menses are mostly absent with OCPs, occas spotting on active pills. Dysmenorrhea none. She does have occas night sweats. Pt wants to cont OCPs for now.   Sex activity: not sexually active--husband with current health issues.  Last Pap: November 09, 2016  Results were: no abnormalities /neg HPV DNA  Hx of STDs: none  Last mammogram: 09/29/18  Results were: normal--routine follow-up in 12 months There is a FH of breast cancer in her mom and pat aunt. There is a FH of ovarian cancer in her pat aunt, and colon cancer in her pat uncle. The patient does do self-breast exams. BRCA neg 6/12; neg MyRisk update testing 2/20. Riskscore=34.5%/IBIS=21%. Taking Vit D supp. Pt aware of scr breast MRI option in conjunction with yearly mammo, but hasn't had yet.  Tobacco use: The patient denies current or previous tobacco use. Alcohol use: none No drug use.  Exercise: min active  She does get adequate calcium and Vitamin D in her diet.  Labs with PCP   Past Medical History:  Diagnosis Date  . Asthma   . BRCA negative 2012, 2/19   BRCA neg 2012; MyRIsk neg 2019  . Depression   . Family history of breast cancer   . Family history of ovarian cancer    6/12 BRCA Neg  . Increased risk of breast cancer 01/2018   IBIS=21%/riskscore=34.5%  . Plantar fasciitis     Past Surgical History:  Procedure Laterality Date  . LYMPH NODE BIOPSY      Family History  Problem Relation Age of Onset  . Breast cancer Mother 1  . Asthma Mother   . Cancer Mother   . Diabetes Mother   . Heart attack Mother   . Breast cancer Paternal Aunt 82  . Colon cancer Paternal Uncle 22  . Ovarian cancer  Paternal Aunt 26  . Cancer Father   . Hyperlipidemia Father   . Hypertension Father   . Asthma Brother   . Cancer Brother   . Depression Brother   . Hypertension Brother   . Learning disabilities Brother   . Miscarriages / [place] Brother   . Mental illness Brother   . Alzheimer's disease Maternal Grandmother   . Cancer Paternal Grandmother        stomach    Social History   Socioeconomic History  . Marital status: Married    Spouse name: Not on file  . Number of children: Not on file  . Years of education: Not on file  . Highest education level: Not on file  Occupational History  . Not on file  Social Needs  . Financial resource strain: Not on file  . Food insecurity    Worry: Not on file    Inability: Not on file  . Transportation needs    Medical: Not on file    Non-medical: Not on file  Tobacco Use  . Smoking status: Never Smoker  . Smokeless tobacco: Never Used  Substance and Sexual Activity  . Alcohol use: No    Frequency: Never  . Drug use: No  . Sexual activity: Not Currently    Birth control/protection:  Pill  Lifestyle  . Physical activity    Days per week: Not on file    Minutes per session: Not on file  . Stress: Not on file  Relationships  . Social Herbalist on phone: Not on file    Gets together: Not on file    Attends religious service: Not on file    Active member of club or organization: Not on file    Attends meetings of clubs or organizations: Not on file    Relationship status: Not on file  . Intimate partner violence    Fear of current or ex partner: Not on file    Emotionally abused: Not on file    Physically abused: Not on file    Forced sexual activity: Not on file  Other Topics Concern  . Not on file  Social History Narrative  . Not on file    Current Meds  Medication Sig  . ADVAIR DISKUS 250-50 MCG/DOSE AEPB   . albuterol (PROVENTIL HFA;VENTOLIN HFA) 108 (90 Base) MCG/ACT inhaler Inhale 2 puffs into the  lungs every 6 (six) hours as needed for wheezing or shortness of breath.  . cetirizine (ZYRTEC ALLERGY) 10 MG tablet Take 1 tablet by mouth daily.  . Cholecalciferol (VITAMIN D-3) 5000 units TABS Take by mouth.  . Levonorgestrel-Ethinyl Estradiol (DAYSEE) 0.15-0.03 &0.01 MG tablet Take 1 tablet by mouth daily.  . montelukast (SINGULAIR) 10 MG tablet   . UNABLE TO FIND Med Name: tru biotics  Once daily  . [DISCONTINUED] DAYSEE 0.15-0.03 &0.01 MG tablet TAKE 1 TABLET BY MOUTH  DAILY     ROS:  Review of Systems  Constitutional: Positive for fatigue. Negative for fever and unexpected weight change.  Respiratory: Negative for cough, shortness of breath and wheezing.   Cardiovascular: Negative for chest pain, palpitations and leg swelling.  Gastrointestinal: Negative for blood in stool, constipation, diarrhea, nausea and vomiting.  Endocrine: Negative for cold intolerance, heat intolerance and polyuria.  Genitourinary: Negative for dyspareunia, dysuria, flank pain, frequency, genital sores, hematuria, menstrual problem, pelvic pain, urgency, vaginal bleeding, vaginal discharge and vaginal pain.  Musculoskeletal: Negative for back pain, joint swelling and myalgias.  Skin: Negative for rash.  Neurological: Negative for dizziness, syncope, light-headedness, numbness and headaches.  Hematological: Negative for adenopathy.  Psychiatric/Behavioral: Negative for agitation, confusion, sleep disturbance and suicidal ideas. The patient is not nervous/anxious.      Objective: BP 134/90   Ht '5\' 6"'  (1.676 m)   Wt 185 lb (83.9 kg)   BMI 29.86 kg/m    Physical Exam Constitutional:      Appearance: She is well-developed.  Genitourinary:     Vulva, vagina, uterus, right adnexa and left adnexa normal.     No vulval lesion or tenderness noted.     No vaginal discharge, erythema or tenderness.     No cervical motion tenderness or polyp.     Uterus is not enlarged or tender.     No right or left  adnexal mass present.     Right adnexa not tender.     Left adnexa not tender.  Neck:     Musculoskeletal: Normal range of motion.     Thyroid: No thyromegaly.  Cardiovascular:     Rate and Rhythm: Normal rate and regular rhythm.     Heart sounds: Normal heart sounds. No murmur.  Pulmonary:     Effort: Pulmonary effort is normal.     Breath sounds: Normal breath sounds.  Chest:  Breasts:        Right: No mass, nipple discharge, skin change or tenderness.        Left: No mass, nipple discharge, skin change or tenderness.  Abdominal:     Palpations: Abdomen is soft.     Tenderness: There is no abdominal tenderness. There is no guarding.  Musculoskeletal: Normal range of motion.  Neurological:     General: No focal deficit present.     Mental Status: She is alert and oriented to person, place, and time.     Cranial Nerves: No cranial nerve deficit.  Skin:    General: Skin is warm and dry.  Psychiatric:        Mood and Affect: Mood normal.        Behavior: Behavior normal.        Thought Content: Thought content normal.        Judgment: Judgment normal.  Vitals signs reviewed.     Assessment/Plan: Encounter for annual routine gynecological examination -   Screening for breast cancer - Plan: MM 3D SCREEN BREAST BILATERAL, Pt to sched mammo  Family history of breast cancer - Plan: Pt is MyRisk neg.   Increased risk of breast cancer - Plan: Cont monthly SBE, yearly CBE and mammos. Dicsussed yearly scr breast MRI. Pt to have mammo first and call for MRI if desires by 3/21. Cont Vit D supp.   Encounter for surveillance of contraceptive pills - OCP RF.  - Plan: Levonorgestrel-Ethinyl Estradiol (DAYSEE) 0.15-0.03 &0.01 MG tablet, OCP RF to optum. Will re-eval need for OCPs next yr since has minimal spotting. Pt wants to cont this yr.   Meds ordered this encounter  Medications  . Levonorgestrel-Ethinyl Estradiol (DAYSEE) 0.15-0.03 &0.01 MG tablet    Sig: Take 1 tablet by mouth  daily.    Dispense:  91 tablet    Refill:  3    Order Specific Question:   Supervising Provider    Answer:   Gae Dry [[id number]]             GYN counsel breast self exam, mammography screening, menopause, adequate intake of calcium and vitamin D, diet and exercise     F/U  Return in about 1 year (around 06/20/2020).  Alicia B. Copland, PA-C 06/21/2019 3:42 PM

## 2019-06-21 ENCOUNTER — Ambulatory Visit (INDEPENDENT_AMBULATORY_CARE_PROVIDER_SITE_OTHER): Payer: Managed Care, Other (non HMO) | Admitting: Obstetrics and Gynecology

## 2019-06-21 ENCOUNTER — Other Ambulatory Visit: Payer: Self-pay

## 2019-06-21 ENCOUNTER — Encounter: Payer: Self-pay | Admitting: Obstetrics and Gynecology

## 2019-06-21 VITALS — BP 134/90 | Ht 66.0 in | Wt 185.0 lb

## 2019-06-21 DIAGNOSIS — Z803 Family history of malignant neoplasm of breast: Secondary | ICD-10-CM | POA: Insufficient documentation

## 2019-06-21 DIAGNOSIS — Z01419 Encounter for gynecological examination (general) (routine) without abnormal findings: Secondary | ICD-10-CM

## 2019-06-21 DIAGNOSIS — Z1239 Encounter for other screening for malignant neoplasm of breast: Secondary | ICD-10-CM

## 2019-06-21 DIAGNOSIS — Z3041 Encounter for surveillance of contraceptive pills: Secondary | ICD-10-CM

## 2019-06-21 DIAGNOSIS — Z9189 Other specified personal risk factors, not elsewhere classified: Secondary | ICD-10-CM | POA: Insufficient documentation

## 2019-06-21 MED ORDER — LEVONORGEST-ETH ESTRAD 91-DAY 0.15-0.03 &0.01 MG PO TABS: 1.0000 | ORAL_TABLET | Freq: Every day | ORAL | 3 refills | Status: DC

## 2019-06-21 NOTE — Patient Instructions (Signed)
I value your feedback and entrusting us with your care. If you get a [facility] patient survey, I would appreciate you taking the time to let us know about your experience today. Thank you! 

## 2019-07-10 ENCOUNTER — Ambulatory Visit: Payer: Managed Care, Other (non HMO) | Admitting: Family Medicine

## 2019-07-19 ENCOUNTER — Ambulatory Visit: Payer: Managed Care, Other (non HMO) | Admitting: Obstetrics and Gynecology

## 2019-09-26 ENCOUNTER — Other Ambulatory Visit: Payer: Self-pay | Admitting: Family Medicine

## 2019-09-26 DIAGNOSIS — Z130 Encounter for screening for diseases of the blood and blood-forming organs and certain disorders involving the immune mechanism: Secondary | ICD-10-CM

## 2019-09-26 DIAGNOSIS — E559 Vitamin D deficiency, unspecified: Secondary | ICD-10-CM

## 2019-09-26 DIAGNOSIS — E663 Overweight: Secondary | ICD-10-CM

## 2019-10-04 ENCOUNTER — Other Ambulatory Visit (INDEPENDENT_AMBULATORY_CARE_PROVIDER_SITE_OTHER): Payer: Managed Care, Other (non HMO)

## 2019-10-04 ENCOUNTER — Other Ambulatory Visit: Payer: Self-pay

## 2019-10-04 DIAGNOSIS — E559 Vitamin D deficiency, unspecified: Secondary | ICD-10-CM

## 2019-10-04 DIAGNOSIS — E663 Overweight: Secondary | ICD-10-CM

## 2019-10-04 DIAGNOSIS — Z130 Encounter for screening for diseases of the blood and blood-forming organs and certain disorders involving the immune mechanism: Secondary | ICD-10-CM

## 2019-10-04 NOTE — Addendum Note (Signed)
Addended by: Ellamae Sia on: 10/04/2019 08:02 AM   Modules accepted: Orders

## 2019-10-05 LAB — CBC WITH DIFFERENTIAL/PLATELET
Basophils Absolute: 0 10*3/uL (ref 0.0–0.2)
Basos: 1 %
EOS (ABSOLUTE): 0.1 10*3/uL (ref 0.0–0.4)
Eos: 1 %
Hematocrit: 39.8 % (ref 34.0–46.6)
Hemoglobin: 13.8 g/dL (ref 11.1–15.9)
Immature Grans (Abs): 0 10*3/uL (ref 0.0–0.1)
Immature Granulocytes: 0 %
Lymphocytes Absolute: 1.8 10*3/uL (ref 0.7–3.1)
Lymphs: 26 %
MCH: 31 pg (ref 26.6–33.0)
MCHC: 34.7 g/dL (ref 31.5–35.7)
MCV: 89 fL (ref 79–97)
Monocytes Absolute: 0.6 10*3/uL (ref 0.1–0.9)
Monocytes: 9 %
Neutrophils Absolute: 4.4 10*3/uL (ref 1.4–7.0)
Neutrophils: 63 %
Platelets: 255 10*3/uL (ref 150–450)
RBC: 4.45 x10E6/uL (ref 3.77–5.28)
RDW: 12.4 % (ref 11.7–15.4)
WBC: 6.9 10*3/uL (ref 3.4–10.8)

## 2019-10-05 LAB — LIPID PANEL
Chol/HDL Ratio: 4.2 ratio (ref 0.0–4.4)
Cholesterol, Total: 169 mg/dL (ref 100–199)
HDL: 40 mg/dL (ref >39)
LDL Chol Calc (NIH): 103 mg/dL — ABNORMAL HIGH (ref 0–99)
Triglycerides: 147 mg/dL (ref 0–149)
VLDL Cholesterol Cal: 26 mg/dL (ref 5–40)

## 2019-10-05 LAB — VITAMIN D 25 HYDROXY (VIT D DEFICIENCY, FRACTURES): Vit D, 25-Hydroxy: 69.4 ng/mL (ref 30.0–100.0)

## 2019-10-05 LAB — HEMOGLOBIN A1C
Est. average glucose Bld gHb Est-mCnc: 111 mg/dL
Hgb A1c MFr Bld: 5.5 % (ref 4.8–5.6)

## 2019-10-11 ENCOUNTER — Ambulatory Visit (INDEPENDENT_AMBULATORY_CARE_PROVIDER_SITE_OTHER): Payer: Managed Care, Other (non HMO) | Admitting: Family Medicine

## 2019-10-11 ENCOUNTER — Other Ambulatory Visit: Payer: Self-pay

## 2019-10-11 ENCOUNTER — Encounter: Payer: Self-pay | Admitting: Family Medicine

## 2019-10-11 ENCOUNTER — Encounter: Payer: Managed Care, Other (non HMO) | Admitting: Family Medicine

## 2019-10-11 ENCOUNTER — Ambulatory Visit: Payer: Managed Care, Other (non HMO) | Admitting: Family Medicine

## 2019-10-11 VITALS — BP 140/82 | HR 71 | Temp 98.2°F | Ht 66.0 in | Wt 173.0 lb

## 2019-10-11 DIAGNOSIS — Z0000 Encounter for general adult medical examination without abnormal findings: Secondary | ICD-10-CM | POA: Diagnosis not present

## 2019-10-11 DIAGNOSIS — Z23 Encounter for immunization: Secondary | ICD-10-CM | POA: Diagnosis not present

## 2019-10-11 DIAGNOSIS — M62838 Other muscle spasm: Secondary | ICD-10-CM

## 2019-10-11 MED ORDER — CYCLOBENZAPRINE HCL 10 MG PO TABS: 10.0000 mg | ORAL_TABLET | Freq: Every evening | ORAL | 0 refills | Status: DC | PRN

## 2019-10-11 NOTE — Progress Notes (Signed)
Established Patient Office Visit  Subjective:  Patient ID: Nicole Harrell, female    DOB: 1971-04-20  Age: 48 y.o. MRN: [id number]  CC:  Chief Complaint  Patient presents with  . Annual Exam    Discuss weight / Neck and shoulder discomfort since working from home last 6 months / Elevated BP    HPI Nicole Harrell presents for routine physical.  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 Pt is very stressed due to current situation. Pt works at home since March. She reports some pain in her neck and upper back. Pt had massage with great relief. She takes Aleve with moderate relief. Pt reports pain gets better after weight lifting exercise. Pt reports pain is better than two weeks ago.      Last CPE-09/09/2018 Pap smear- 2017 Mammogram- scheduled for 09/2019 Flu-today Dental- as needed Eye- as needed Diet- watches her carbohydrates, counts micros Armed forces technical officer, lifting weights  Past Medical History:  Diagnosis Date  . Asthma   . BRCA negative 2012, 2/19   BRCA neg 2012; MyRIsk neg 2019  . Depression   . Family history of breast cancer   . Family history of ovarian cancer    6/12 BRCA Neg  . Increased risk of breast cancer 01/2018   IBIS=21%/riskscore=34.5%  . Plantar fasciitis     Past Surgical History:  Procedure Laterality Date  . LYMPH NODE BIOPSY      Family History  Problem Relation Age of Onset  . Breast cancer Mother 27  . Asthma Mother   . Cancer Mother   . Diabetes Mother   . Heart attack Mother   . Breast cancer Paternal Aunt 46  . Colon cancer Paternal Uncle 57  . Ovarian cancer Paternal Aunt 36  . Cancer Father   . Hyperlipidemia Father   . Hypertension Father   . Asthma Brother   . Cancer Brother   . Depression Brother   . Hypertension Brother   . Learning disabilities Brother   . Miscarriages / [place] Brother   . Mental illness Brother   . Alzheimer's  disease Maternal Grandmother   . Cancer Paternal Grandmother        stomach    Social History   Socioeconomic History  . Marital status: Married    Spouse name: Not on file  . Number of children: Not on file  . Years of education: Not on file  . Highest education level: Not on file  Occupational History  . Not on file  Social Needs  . Financial resource strain: Not on file  . Food insecurity    Worry: Not on file    Inability: Not on file  . Transportation needs    Medical: Not on file    Non-medical: Not on file  Tobacco Use  . Smoking status: Never Smoker  . Smokeless tobacco: Never Used  Substance and Sexual Activity  . Alcohol use: No    Frequency: Never  . Drug use: No  . Sexual activity: Not Currently    Birth control/protection: Pill  Lifestyle  . Physical activity    Days per week: Not on file    Minutes per session: Not on file  . Stress: Not on file  Relationships  . Social Herbalist on phone: Not on file    Gets together: Not on file    Attends religious service: Not on file  Active member of club or organization: Not on file    Attends meetings of clubs or organizations: Not on file    Relationship status: Not on file  . Intimate partner violence    Fear of current or ex partner: Not on file    Emotionally abused: Not on file    Physically abused: Not on file    Forced sexual activity: Not on file  Other Topics Concern  . Not on file  Social History Narrative  . Not on file    Outpatient Medications Prior to Visit  Medication Sig Dispense Refill  . ADVAIR DISKUS 250-50 MCG/DOSE AEPB     . albuterol (PROVENTIL HFA;VENTOLIN HFA) 108 (90 Base) MCG/ACT inhaler Inhale 2 puffs into the lungs every 6 (six) hours as needed for wheezing or shortness of breath. 1 Inhaler 2  . cetirizine (ZYRTEC ALLERGY) 10 MG tablet Take 1 tablet by mouth daily.    . Cholecalciferol (VITAMIN D-3) 5000 units TABS Take by mouth.    . clindamycin (CLEOCIN) 300  MG capsule TAKE 1 CAPSULE BY MOUTH EVERY 6 HOURS UNTIL FINISHED    . Levonorgestrel-Ethinyl Estradiol (DAYSEE) 0.15-0.03 &0.01 MG tablet Take 1 tablet by mouth daily. 91 tablet 3  . montelukast (SINGULAIR) 10 MG tablet     . UNABLE TO FIND Med Name: tru biotics  Once daily     No facility-administered medications prior to visit.     Allergies  Allergen Reactions  . Iodinated Diagnostic Agents Hives  . Other Hives    Seasonal, cats, dogs  . Penicillins Hives    ROS Review of Systems  Constitutional: Negative for activity change, appetite change, fatigue and fever.  HENT: Negative for congestion, ear pain, facial swelling, mouth sores, rhinorrhea, sinus pressure, sinus pain and sore throat.   Eyes: Negative for pain, discharge and visual disturbance.  Respiratory: Negative for cough, chest tightness, shortness of breath and wheezing.   Cardiovascular: Negative for chest pain, palpitations and leg swelling.  Gastrointestinal: Negative for abdominal distention, abdominal pain, blood in stool, constipation, diarrhea and nausea.  Genitourinary: Negative for difficulty urinating, hematuria and urgency.  Musculoskeletal: Positive for neck pain. Negative for arthralgias.       Neck/upper back pain  Skin: Negative for color change, pallor, rash and wound.  Neurological: Negative for dizziness, facial asymmetry, weakness, light-headedness, numbness and headaches.      Objective:    Physical Exam  Constitutional: She is oriented to person, place, and time. She appears well-developed and well-nourished.  HENT:  Head: Normocephalic and atraumatic.  Eyes: Pupils are equal, round, and reactive to light. Conjunctivae and EOM are normal.  Cardiovascular: Normal rate, regular rhythm and normal heart sounds. Exam reveals no gallop and no friction rub.  No murmur heard. Pulmonary/Chest: Effort normal and breath sounds normal. She has no wheezes. She has no rales.  Abdominal: Soft. Bowel sounds  are normal. She exhibits no distension. There is no abdominal tenderness.  Musculoskeletal: Normal range of motion.        General: No tenderness or edema.     Comments: Neck/upper shoulder pain  Neurological: She is alert and oriented to person, place, and time.  Skin: Skin is warm and dry.    BP 140/82 (BP Location: Left Arm, Patient Position: Sitting, Cuff Size: Normal)   Pulse 71   Temp 98.2 F (36.8 C) (Temporal)   Ht '5\' 6"'$  (1.676 m)   Wt 78.5 kg   SpO2 98%   BMI 27.92 kg/m  Wt Readings from Last 3 Encounters:  10/11/19 78.5 kg  06/21/19 83.9 kg  12/13/18 80.9 kg     Health Maintenance Due  Topic Date Due  . HIV Screening  08/19/1986  . INFLUENZA VACCINE  07/22/2019  . PAP SMEAR-Modifier  11/10/2019    There are no preventive care reminders to display for this patient.  Lab Results  Component Value Date   TSH 1.230 09/09/2018   Lab Results  Component Value Date   WBC 6.9 10/04/2019   HGB 13.8 10/04/2019   HCT 39.8 10/04/2019   MCV 89 10/04/2019   PLT 255 10/04/2019   No results found for: NA, K, CHLORIDE, CO2, GLUCOSE, BUN, CREATININE, BILITOT, ALKPHOS, AST, ALT, PROT, ALBUMIN, CALCIUM, ANIONGAP, EGFR, GFR Lab Results  Component Value Date   CHOL 169 10/04/2019   Lab Results  Component Value Date   HDL 40 10/04/2019   Lab Results  Component Value Date   LDLCALC 103 (H) 10/04/2019   Lab Results  Component Value Date   TRIG 147 10/04/2019   Lab Results  Component Value Date   CHOLHDL 4.2 10/04/2019   Lab Results  Component Value Date   HGBA1C 5.5 10/04/2019      Assessment & Plan:   1. Annual physical exam Pt is encouraged to continue with healthy life style changes, physical activities and healthy dietary changes  2. Cervical paraspinal muscle spasm Pt prescribed cyclobenzaprine to take as needed for shoulder and upper back pain - cyclobenzaprine (FLEXERIL) 10 MG tablet; Take 1 tablet (10 mg total) by mouth at bedtime as needed for  muscle spasms.  Dispense: 30 tablet; Refill: 0  3. Need for influenza vaccination recommended - Flu Vaccine QUAD 6+ mos PF IM (Fluarix Quad PF)  Problem List Items Addressed This Visit    None      No orders of the defined types were placed in this encounter.   Follow-up: No follow-ups on file.    Rica Koyanagi, RN

## 2019-10-11 NOTE — Progress Notes (Signed)
Subjective:    Patient ID: Nicole Harrell, female    DOB: 1971/12/15, 48 y.o.   MRN: [id number]  HPI This is a 48 yo female who presents today for CPE. Has been doing ok, continues to work from home with [employer]. Enjoys working from home. Some increased stress with her brother who has disability. More difficult to get services for him.   She had gained weight and had labs drawn at work which showed elevated lipid levels. She has been working with a Clinical research associate for the last 2 months and counting macros. She has lost 12 pounds. Feels better.    Last CPE- annual well woman with gyn Mammo- 09/29/2018, scheduled for next week Pap- 11/09/2016, negative, negative HPV Colonoscopy- NA Tdap- 01/09/2015 Flu- annual Eye- regular Dental- regular Exercise- weight training 3x/ week Diet- following macros  Pain in neck to upper shoulders. Most days, improved with exercise, massage, Alleve 1 tablet. Feels tight. Some pain at night. Some clicking in left shoulder with abduction, no pain.   Past Medical History:  Diagnosis Date  . Asthma   . BRCA negative 2012, 2/19   BRCA neg 2012; MyRIsk neg 2019  . Depression   . Family history of breast cancer   . Family history of ovarian cancer    6/12 BRCA Neg  . Increased risk of breast cancer 01/2018   IBIS=21%/riskscore=34.5%  . Plantar fasciitis    Past Surgical History:  Procedure Laterality Date  . LYMPH NODE BIOPSY     Family History  Problem Relation Age of Onset  . Breast cancer Mother 63  . Asthma Mother   . Cancer Mother   . Diabetes Mother   . Heart attack Mother   . Breast cancer Paternal Aunt 32  . Colon cancer Paternal Uncle 80  . Ovarian cancer Paternal Aunt 2  . Cancer Father   . Hyperlipidemia Father   . Hypertension Father   . Asthma Brother   . Cancer Brother   . Depression Brother   . Hypertension Brother   . Learning disabilities Brother   . Miscarriages / [place] Brother   . Mental illness Brother   .  Alzheimer's disease Maternal Grandmother   . Cancer Paternal Grandmother        stomach   Social History   Tobacco Use  . Smoking status: Never Smoker  . Smokeless tobacco: Never Used  Substance Use Topics  . Alcohol use: No    Frequency: Never  . Drug use: No      Review of Systems  Constitutional: Negative.   HENT: Negative.   Eyes: Negative.   Respiratory: Negative.   Cardiovascular: Negative.   Gastrointestinal: Negative.   Endocrine: Negative.   Genitourinary: Negative.   Musculoskeletal: Positive for neck pain.  Skin: Negative.   Allergic/Immunologic: Negative.   Neurological: Negative.   Hematological: Negative.   Psychiatric/Behavioral: Negative.        Objective:   Physical Exam Vitals signs reviewed.  Constitutional:      General: She is not in acute distress.    Appearance: Normal appearance. She is normal weight. She is not ill-appearing, toxic-appearing or diaphoretic.  HENT:     Head: Normocephalic and atraumatic.     Right Ear: External ear normal.     Left Ear: External ear normal.  Eyes:     Conjunctiva/sclera: Conjunctivae normal.  Cardiovascular:     Rate and Rhythm: Normal rate and regular rhythm.     Heart sounds: Normal  heart sounds.  Pulmonary:     Effort: Pulmonary effort is normal.     Breath sounds: Normal breath sounds.  Abdominal:     General: Abdomen is flat. There is no distension.     Palpations: Abdomen is soft. There is no mass.     Tenderness: There is no abdominal tenderness. There is no guarding or rebound.     Hernia: No hernia is present.  Musculoskeletal:     Cervical back: She exhibits tenderness (trapezius). She exhibits normal range of motion, no bony tenderness and no swelling.  Skin:    General: Skin is warm and dry.  Neurological:     Mental Status: She is alert and oriented to person, place, and time.  Psychiatric:        Mood and Affect: Mood normal.        Behavior: Behavior normal.        Thought  Content: Thought content normal.        Judgment: Judgment normal.       BP 140/82 (BP Location: Left Arm, Patient Position: Sitting, Cuff Size: Normal)   Pulse 71   Temp 98.2 F (36.8 C) (Temporal)   Ht '5\' 6"'  (1.676 m)   Wt 173 lb (78.5 kg)   SpO2 98%   BMI 27.92 kg/m  Wt Readings from Last 3 Encounters:  10/11/19 173 lb (78.5 kg)  06/21/19 185 lb (83.9 kg)  12/13/18 178 lb 6.4 oz (80.9 kg)   Depression screen [facility] 2/9 10/11/2019 12/13/2018  Decreased Interest 0 0  Down, Depressed, Hopeless 0 0  PHQ - 2 Score 0 0        Assessment & Plan:  1. Annual physical exam  - Discussed and encouraged healthy lifestyle choices- adequate sleep, regular exercise, stress management and healthy food choices.    2. Cervical paraspinal muscle spasm - encouraged her to continue daily stretching, massage, NSAIDs as needed, standing desk - cyclobenzaprine (FLEXERIL) 10 MG tablet; Take 1 tablet (10 mg total) by mouth at bedtime as needed for muscle spasms.  Dispense: 30 tablet; Refill: 0 - follow up if no improvement with above measures  Clarene Reamer, FNP-BC  [facility], [place]  10/11/2019 10:41 AM

## 2019-10-11 NOTE — Patient Instructions (Signed)
Great to see you today  Keep up the good work with your diet and exercise!   Health Maintenance, Female Adopting a healthy lifestyle and getting preventive care are important in promoting health and wellness. Ask your health care provider about:  The right schedule for you to have regular tests and exams.  Things you can do on your own to prevent diseases and keep yourself healthy. What should I know about diet, weight, and exercise? Eat a healthy diet   Eat a diet that includes plenty of vegetables, fruits, low-fat dairy products, and lean protein.  Do not eat a lot of foods that are high in solid fats, added sugars, or sodium. Maintain a healthy weight Body mass index (BMI) is used to identify weight problems. It estimates body fat based on height and weight. Your health care provider can help determine your BMI and help you achieve or maintain a healthy weight. Get regular exercise Get regular exercise. This is one of the most important things you can do for your health. Most adults should:  Exercise for at least 150 minutes each week. The exercise should increase your heart rate and make you sweat (moderate-intensity exercise).  Do strengthening exercises at least twice a week. This is in addition to the moderate-intensity exercise.  Spend less time sitting. Even light physical activity can be beneficial. Watch cholesterol and blood lipids Have your blood tested for lipids and cholesterol at 48 years of age, then have this test every 5 years. Have your cholesterol levels checked more often if:  Your lipid or cholesterol levels are high.  You are older than 48 years of age.  You are at high risk for heart disease. What should I know about cancer screening? Depending on your health history and family history, you may need to have cancer screening at various ages. This may include screening for:  Breast cancer.  Cervical cancer.  Colorectal cancer.  Skin cancer.  Lung  cancer. What should I know about heart disease, diabetes, and high blood pressure? Blood pressure and heart disease  High blood pressure causes heart disease and increases the risk of stroke. This is more likely to develop in people who have high blood pressure readings, are of African descent, or are overweight.  Have your blood pressure checked: ? Every 3-5 years if you are 75-11 years of age. ? Every year if you are 72 years old or older. Diabetes Have regular diabetes screenings. This checks your fasting blood sugar level. Have the screening done:  Once every three years after age 66 if you are at a normal weight and have a low risk for diabetes.  More often and at a younger age if you are overweight or have a high risk for diabetes. What should I know about preventing infection? Hepatitis B If you have a higher risk for hepatitis B, you should be screened for this virus. Talk with your health care provider to find out if you are at risk for hepatitis B infection. Hepatitis C Testing is recommended for:  Everyone born from [birth year] through 1965.  Anyone with known risk factors for hepatitis C. Sexually transmitted infections (STIs)  Get screened for STIs, including gonorrhea and chlamydia, if: ? You are sexually active and are younger than 48 years of age. ? You are older than 48 years of age and your health care provider tells you that you are at risk for this type of infection. ? Your sexual activity has changed since you were  last screened, and you are at increased risk for chlamydia or gonorrhea. Ask your health care provider if you are at risk.  Ask your health care provider about whether you are at high risk for HIV. Your health care provider may recommend a prescription medicine to help prevent HIV infection. If you choose to take medicine to prevent HIV, you should first get tested for HIV. You should then be tested every 3 months for as long as you are taking the medicine.  Pregnancy  If you are about to stop having your period (premenopausal) and you may become pregnant, seek counseling before you get pregnant.  Take 400 to 800 micrograms (mcg) of folic acid every day if you become pregnant.  Ask for birth control (contraception) if you want to prevent pregnancy. Osteoporosis and menopause Osteoporosis is a disease in which the bones lose minerals and strength with aging. This can result in bone fractures. If you are 51 years old or older, or if you are at risk for osteoporosis and fractures, ask your health care provider if you should:  Be screened for bone loss.  Take a calcium or vitamin D supplement to lower your risk of fractures.  Be given hormone replacement therapy (HRT) to treat symptoms of menopause. Follow these instructions at home: Lifestyle  Do not use any products that contain nicotine or tobacco, such as cigarettes, e-cigarettes, and chewing tobacco. If you need help quitting, ask your health care provider.  Do not use street drugs.  Do not share needles.  Ask your health care provider for help if you need support or information about quitting drugs. Alcohol use  Do not drink alcohol if: ? Your health care provider tells you not to drink. ? You are pregnant, may be pregnant, or are planning to become pregnant.  If you drink alcohol: ? Limit how much you use to 0-1 drink a day. ? Limit intake if you are breastfeeding.  Be aware of how much alcohol is in your drink. In the [place] one drink equals one 12 oz bottle of beer (355 mL), one 5 oz glass of wine (148 mL), or one 1 oz glass of hard liquor (44 mL). General instructions  Schedule regular health, dental, and eye exams.  Stay current with your vaccines.  Tell your health care provider if: ? You often feel depressed. ? You have ever been abused or do not feel safe at home. Summary  Adopting a healthy lifestyle and getting preventive care are important in promoting health  and wellness.  Follow your health care provider's instructions about healthy diet, exercising, and getting tested or screened for diseases.  Follow your health care provider's instructions on monitoring your cholesterol and blood pressure. This information is not intended to replace advice given to you by your health care provider. Make sure you discuss any questions you have with your health care provider. Document Released: 06/22/2011 Document Revised: 11/30/2018 Document Reviewed: 11/30/2018 Elsevier Patient Education  2020 [employer].

## 2019-10-13 ENCOUNTER — Telehealth: Payer: Self-pay | Admitting: Family Medicine

## 2019-10-13 NOTE — Telephone Encounter (Signed)
Pt came into office and dropped off Accomodation for standing desk form to be filled out. She said contact her if you any questions. Placed form in Rx tower for [employer].   CB [phone]

## 2019-10-16 ENCOUNTER — Ambulatory Visit
Admission: RE | Admit: 2019-10-16 | Discharge: 2019-10-16 | Disposition: A | Discharge status: Home / Self Care | Payer: Managed Care, Other (non HMO) | Source: Ambulatory Visit | Attending: Obstetrics and Gynecology | Admitting: Obstetrics and Gynecology

## 2019-10-16 DIAGNOSIS — Z1231 Encounter for screening mammogram for malignant neoplasm of breast: Secondary | ICD-10-CM | POA: Insufficient documentation

## 2019-10-16 DIAGNOSIS — Z1239 Encounter for other screening for malignant neoplasm of breast: Secondary | ICD-10-CM

## 2019-10-16 NOTE — Telephone Encounter (Signed)
Forms completed. Placed in Nicole Harrell's to do folder. Please copy and send to scan. Call patient and let her know that forms ready for pick up.

## 2019-10-16 NOTE — Telephone Encounter (Signed)
Placed in Debbie's inbasket to review

## 2019-10-17 ENCOUNTER — Encounter: Payer: Self-pay | Admitting: Obstetrics and Gynecology

## 2019-10-18 NOTE — Telephone Encounter (Signed)
LM for patient making her aware that her forms are ready to be picked up.  Copies made, placed to scan. Originals placed in file folder up front.

## 2020-02-15 ENCOUNTER — Telehealth: Payer: Managed Care, Other (non HMO) | Admitting: Emergency Medicine

## 2020-02-15 DIAGNOSIS — U071 COVID-19: Secondary | ICD-10-CM

## 2020-02-15 MED ORDER — ALBUTEROL SULFATE HFA 108 (90 BASE) MCG/ACT IN AERS: 2.0000 | INHALATION_SPRAY | Freq: Four times a day (QID) | RESPIRATORY_TRACT | 0 refills | Status: DC | PRN

## 2020-02-15 MED ORDER — BENZONATATE 100 MG PO CAPS: 100.0000 mg | ORAL_CAPSULE | Freq: Two times a day (BID) | ORAL | 0 refills | Status: DC | PRN

## 2020-02-15 NOTE — Progress Notes (Signed)
We are sorry you are not feeling well. We are here to help!  You have tested positive for COVID-19, meaning that you were infected with the novel coronavirus and could give the germ to others.    You have been enrolled in [facility] for COVID-19. Daily you will receive a questionnaire within the [place] website. Our COVID-19 response team will be monitoring your responses daily.  Please continue isolation at home, for at least 10 days since the start of your symptoms and until you have had 24 hours with no fever (without taking a fever reducer) and with improving of symptoms.  Please continue good preventive care measures, including:  frequent hand-washing, avoid touching your face, cover coughs/sneezes, stay out of crowds and keep a 6 foot distance from others.  Follow up with your provider or go to the nearest hospital ED for re-assessment if fever/cough/breathlessness return.  The following symptoms may appear 2-14 days after exposure: . Fever . Cough . Shortness of breath or difficulty breathing . Chills . Repeated shaking with chills . Muscle pain . Headache . Sore throat . New loss of taste or smell . Fatigue . Congestion or runny nose . Nausea or vomiting . Diarrhea  Go to the nearest hospital ED for assessment if fever/cough/breathlessness are severe or illness seems like a threat to life.  It is vitally important that if you feel that you have an infection such as this virus or any other virus that you stay home and away from places where you may spread it to others.  You should avoid contact with people age 39 and older.   You can use medication such as A prescription cough medication called Tessalon Perles 100 mg. You may take 1-2 capsules every 8 hours as needed for cough.  I have also refilled your inhaler.  Antibiotics are not indicated in COVID-19 infections.  If you are interested in a monoclonal antibody infusion, please contact your doctor tomorrow.  [facility] has a limited number of spots available and they are generally given to those most at risk, but it might worth looking into.  I am unable to set you up for this over an e-visit.  You may also take acetaminophen (Tylenol) as needed for fever.  Reduce your risk of any infection by using the same precautions used for avoiding the common cold or flu:  Marland Kitchen Wash your hands often with soap and warm water for at least 20 seconds.  If soap and water are not readily available, use an alcohol-based hand sanitizer with at least 60% alcohol.  . If coughing or sneezing, cover your mouth and nose by coughing or sneezing into the elbow areas of your shirt or coat, into a tissue or into your sleeve (not your hands). . Avoid shaking hands with others and consider head nods or verbal greetings only. . Avoid touching your eyes, nose, or mouth with unwashed hands.  . Avoid close contact with people who are sick. . Avoid places or events with large numbers of people in one location, like concerts or sporting events. . Carefully consider travel plans you have or are making. . If you are planning any travel outside or inside the [place], visit the CDC's Travelers' Health webpage for the latest health notices. . If you have some symptoms but not all symptoms, continue to monitor at home and seek medical attention if your symptoms worsen. . If you are having a medical emergency, call 911.  HOME CARE .  Only take medications as instructed by your medical team. . Drink plenty of fluids and get plenty of rest. . A steam or ultrasonic humidifier can help if you have congestion.   GET HELP RIGHT AWAY IF YOU HAVE EMERGENCY WARNING SIGNS** FOR COVID-19. If you or someone is showing any of these signs seek emergency medical care immediately. Call 911 or proceed to your closest emergency facility if: . You develop worsening high fever. . Trouble breathing . Bluish lips or face . Persistent pain or pressure in the chest . New  confusion . Inability to wake or stay awake . You cough up blood. . Your symptoms become more severe  **This list is not all possible symptoms. Contact your medical provider for any symptoms that are sever or concerning to you.  MAKE SURE YOU   Understand these instructions.  Will watch your condition.  Will get help right away if you are not doing well or get worse.  Your e-visit answers were reviewed by a board certified advanced clinical practitioner to complete your personal care plan.  Depending on the condition, your plan could have included both over the counter or prescription medications.  If there is a problem please reply once you have received a response from your provider.  Your safety is important to [place].  If you have drug allergies check your prescription carefully.    You can use MyChart to ask questions about today's visit, request a non-urgent call back, or ask for a work or school excuse for 24 hours related to this e-Visit. If it has been greater than 24 hours you will need to follow up with your provider, or enter a new e-Visit to address those concerns. You will get an e-mail in the next two days asking about your experience.  I hope that your e-visit has been valuable and will speed your recovery. Thank you for using e-visits.    Greater than 5 minutes, yet less than 10 minutes was used in reviewing the patient's chart, questionnaire, prescribing medications, and documentation for this visit.

## 2020-06-26 ENCOUNTER — Ambulatory Visit: Payer: Managed Care, Other (non HMO) | Admitting: Obstetrics and Gynecology

## 2020-06-28 ENCOUNTER — Other Ambulatory Visit: Payer: Self-pay | Admitting: Obstetrics and Gynecology

## 2020-06-28 DIAGNOSIS — Z3041 Encounter for surveillance of contraceptive pills: Secondary | ICD-10-CM

## 2020-07-31 NOTE — Progress Notes (Deleted)
PCP:  Elby Beck, FNP   No chief complaint on file.    HPI:      [name] is a 49 y.o. No obstetric history on file. who LMP was No LMP recorded. (Menstrual status: Oral contraceptives)., presents today for her annual examination.  Her menses are mostly absent with OCPs, occas spotting on active pills. Dysmenorrhea none. She does have occas night sweats. Pt wants to cont OCPs for now.   Sex activity: not sexually active--husband with current health issues.  Last Pap: 11/09/16   Results were: no abnormalities /neg HPV DNA  Hx of STDs: none  Last mammogram: 10/16/19  Results were: normal--routine follow-up in 12 months There is a FH of breast cancer in her mom and pat aunt. There is a FH of ovarian cancer in her pat aunt, and colon cancer in her pat uncle. The patient does do self-breast exams. BRCA neg 6/12; neg MyRisk update testing 2/20. Riskscore=34.5%/IBIS=21%. Taking Vit D supp. Pt aware of scr breast MRI option in conjunction with yearly mammo, but hasn't had yet.  Tobacco use: The patient denies current or previous tobacco use. Alcohol use: none No drug use.  Exercise: min active  She does get adequate calcium and Vitamin D in her diet.  Labs with PCP   Past Medical History:  Diagnosis Date  . Asthma   . BRCA negative 2012, 2/19   BRCA neg 2012; MyRIsk neg 2019  . Depression   . Family history of breast cancer   . Family history of ovarian cancer    6/12 BRCA Neg  . Increased risk of breast cancer 01/2018   IBIS=21%/riskscore=34.5%  . Plantar fasciitis     Past Surgical History:  Procedure Laterality Date  . LYMPH NODE BIOPSY      Family History  Problem Relation Age of Onset  . Breast cancer Mother 9  . Asthma Mother   . Cancer Mother   . Diabetes Mother   . Heart attack Mother   . Breast cancer Paternal Aunt 80  . Colon cancer Paternal Uncle 62  . Ovarian cancer Paternal Aunt 13  . Cancer Father   . Hyperlipidemia Father     . Hypertension Father   . Asthma Brother   . Cancer Brother   . Depression Brother   . Hypertension Brother   . Learning disabilities Brother   . Miscarriages / [place] Brother   . Mental illness Brother   . Alzheimer's disease Maternal Grandmother   . Cancer Paternal Grandmother        stomach    Social History   Socioeconomic History  . Marital status: Married    Spouse name: Not on file  . Number of children: Not on file  . Years of education: Not on file  . Highest education level: Not on file  Occupational History  . Not on file  Tobacco Use  . Smoking status: Never Smoker  . Smokeless tobacco: Never Used  Vaping Use  . Vaping Use: Never used  Substance and Sexual Activity  . Alcohol use: No  . Drug use: No  . Sexual activity: Not Currently    Birth control/protection: Pill  Other Topics Concern  . Not on file  Social History Narrative  . Not on file   Social Determinants of Health   Financial Resource Strain:   . Difficulty of Paying Living Expenses:   Food Insecurity:   . Worried About Charity fundraiser in the  Last Year:   . [place] in the Last Year:   Transportation Needs:   . Film/video editor (Medical):   Marland Kitchen Lack of Transportation (Non-Medical):   Physical Activity:   . Days of Exercise per Week:   . Minutes of Exercise per Session:   Stress:   . Feeling of Stress :   Social Connections:   . Frequency of Communication with Friends and Family:   . Frequency of Social Gatherings with Friends and Family:   . Attends Religious Services:   . Active Member of Clubs or Organizations:   . Attends Archivist Meetings:   Marland Kitchen Marital Status:   Intimate Partner Violence:   . Fear of Current or Ex-Partner:   . Emotionally Abused:   Marland Kitchen Physically Abused:   . Sexually Abused:     No outpatient medications have been marked as taking for the 08/01/20 encounter (Appointment) with Deshannon Seide, Deirdre Evener, PA-C.     ROS:  Review of  Systems  Constitutional: Positive for fatigue. Negative for fever and unexpected weight change.  Respiratory: Negative for cough, shortness of breath and wheezing.   Cardiovascular: Negative for chest pain, palpitations and leg swelling.  Gastrointestinal: Negative for blood in stool, constipation, diarrhea, nausea and vomiting.  Endocrine: Negative for cold intolerance, heat intolerance and polyuria.  Genitourinary: Negative for dyspareunia, dysuria, flank pain, frequency, genital sores, hematuria, menstrual problem, pelvic pain, urgency, vaginal bleeding, vaginal discharge and vaginal pain.  Musculoskeletal: Negative for back pain, joint swelling and myalgias.  Skin: Negative for rash.  Neurological: Negative for dizziness, syncope, light-headedness, numbness and headaches.  Hematological: Negative for adenopathy.  Psychiatric/Behavioral: Negative for agitation, confusion, sleep disturbance and suicidal ideas. The patient is not nervous/anxious.      Objective: There were no vitals taken for this visit.   Physical Exam Constitutional:      Appearance: She is well-developed.  Genitourinary:     Vulva, vagina, uterus, right adnexa and left adnexa normal.     No vulval lesion or tenderness noted.     No vaginal discharge, erythema or tenderness.     No cervical motion tenderness or polyp.     Uterus is not enlarged or tender.     No right or left adnexal mass present.     Right adnexa not tender.     Left adnexa not tender.  Neck:     Thyroid: No thyromegaly.  Cardiovascular:     Rate and Rhythm: Normal rate and regular rhythm.     Heart sounds: Normal heart sounds. No murmur heard.   Pulmonary:     Effort: Pulmonary effort is normal.     Breath sounds: Normal breath sounds.  Chest:     Breasts:        Right: No mass, nipple discharge, skin change or tenderness.        Left: No mass, nipple discharge, skin change or tenderness.  Abdominal:     Palpations: Abdomen is soft.       Tenderness: There is no abdominal tenderness. There is no guarding.  Musculoskeletal:        General: Normal range of motion.     Cervical back: Normal range of motion.  Neurological:     General: No focal deficit present.     Mental Status: She is alert and oriented to person, place, and time.     Cranial Nerves: No cranial nerve deficit.  Skin:    General: Skin is warm  and dry.  Psychiatric:        Mood and Affect: Mood normal.        Behavior: Behavior normal.        Thought Content: Thought content normal.        Judgment: Judgment normal.  Vitals reviewed.     Assessment/Plan: Encounter for annual routine gynecological examination -   Screening for breast cancer - Plan: MM 3D SCREEN BREAST BILATERAL, Pt to sched mammo  Family history of breast cancer - Plan: Pt is MyRisk neg.   Increased risk of breast cancer - Plan: Cont monthly SBE, yearly CBE and mammos. Dicsussed yearly scr breast MRI. Pt to have mammo first and call for MRI if desires by 3/21. Cont Vit D supp.   Encounter for surveillance of contraceptive pills - OCP RF.  - Plan: Levonorgestrel-Ethinyl Estradiol (DAYSEE) 0.15-0.03 &0.01 MG tablet, OCP RF to optum. Will re-eval need for OCPs next yr since has minimal spotting. Pt wants to cont this yr.   No orders of the defined types were placed in this encounter.            GYN counsel breast self exam, mammography screening, menopause, adequate intake of calcium and vitamin D, diet and exercise     F/U  No follow-ups on file.  Talesha Ellithorpe B. Lateefa Crosby, PA-C 07/31/2020 10:36 AM

## 2020-08-01 ENCOUNTER — Ambulatory Visit: Payer: Managed Care, Other (non HMO) | Admitting: Obstetrics and Gynecology

## 2020-09-09 NOTE — Progress Notes (Deleted)
PCP:  Elby Beck, FNP   No chief complaint on file.    HPI:      [name] is a 49 y.o. No obstetric history on file. who LMP was No LMP recorded. (Menstrual status: Oral contraceptives)., presents today for her annual examination.  Her menses are mostly absent with OCPs, occas spotting on active pills. Dysmenorrhea none. She does have occas night sweats. Pt wants to cont OCPs for now.   Sex activity: not sexually active--husband with current health issues.  Last Pap: November 09, 2016  Results were: no abnormalities /neg HPV DNA  Hx of STDs: none  Last mammogram: 10/16/19  Results were: normal--routine follow-up in 12 months There is a FH of breast cancer in her mom and pat aunt. There is a FH of ovarian cancer in her pat aunt, and colon cancer in her pat uncle. The patient does do self-breast exams. BRCA neg 6/12; neg MyRisk update testing 2/20. Riskscore=34.5%/IBIS=21%. Taking Vit D supp. Pt aware of scr breast MRI option in conjunction with yearly mammo, but hasn't had yet.  Tobacco use: The patient denies current or previous tobacco use. Alcohol use: none No drug use.  Exercise: min active  Colonoscopy: never  She does get adequate calcium and Vitamin D in her diet.  Labs with PCP   Past Medical History:  Diagnosis Date  . Asthma   . BRCA negative 2012, 2/19   BRCA neg 2012; MyRIsk neg 2019  . Depression   . Family history of breast cancer   . Family history of ovarian cancer    6/12 BRCA Neg  . Increased risk of breast cancer 01/2018   IBIS=21%/riskscore=34.5%  . Plantar fasciitis     Past Surgical History:  Procedure Laterality Date  . LYMPH NODE BIOPSY      Family History  Problem Relation Age of Onset  . Breast cancer Mother 21  . Asthma Mother   . Cancer Mother   . Diabetes Mother   . Heart attack Mother   . Breast cancer Paternal Aunt 33  . Colon cancer Paternal Uncle 53  . Ovarian cancer Paternal Aunt 20  . Cancer Father    . Hyperlipidemia Father   . Hypertension Father   . Asthma Brother   . Cancer Brother   . Depression Brother   . Hypertension Brother   . Learning disabilities Brother   . Miscarriages / [place] Brother   . Mental illness Brother   . Alzheimer's disease Maternal Grandmother   . Cancer Paternal Grandmother        stomach    Social History   Socioeconomic History  . Marital status: Married    Spouse name: Not on file  . Number of children: Not on file  . Years of education: Not on file  . Highest education level: Not on file  Occupational History  . Not on file  Tobacco Use  . Smoking status: Never Smoker  . Smokeless tobacco: Never Used  Vaping Use  . Vaping Use: Never used  Substance and Sexual Activity  . Alcohol use: No  . Drug use: No  . Sexual activity: Not Currently    Birth control/protection: Pill  Other Topics Concern  . Not on file  Social History Narrative  . Not on file   Social Determinants of Health   Financial Resource Strain:   . Difficulty of Paying Living Expenses: Not on file  Food Insecurity:   . Worried About Running  Out of Food in the Last Year: Not on file  . Ran Out of Food in the Last Year: Not on file  Transportation Needs:   . Lack of Transportation (Medical): Not on file  . Lack of Transportation (Non-Medical): Not on file  Physical Activity:   . Days of Exercise per Week: Not on file  . Minutes of Exercise per Session: Not on file  Stress:   . Feeling of Stress : Not on file  Social Connections:   . Frequency of Communication with Friends and Family: Not on file  . Frequency of Social Gatherings with Friends and Family: Not on file  . Attends Religious Services: Not on file  . Active Member of Clubs or Organizations: Not on file  . Attends Archivist Meetings: Not on file  . Marital Status: Not on file  Intimate Partner Violence:   . Fear of Current or Ex-Partner: Not on file  . Emotionally Abused: Not on file   . Physically Abused: Not on file  . Sexually Abused: Not on file    No outpatient medications have been marked as taking for the 09/10/20 encounter (Appointment) with Amarri Satterly, Deirdre Evener, PA-C.     ROS:  Review of Systems  Constitutional: Positive for fatigue. Negative for fever and unexpected weight change.  Respiratory: Negative for cough, shortness of breath and wheezing.   Cardiovascular: Negative for chest pain, palpitations and leg swelling.  Gastrointestinal: Negative for blood in stool, constipation, diarrhea, nausea and vomiting.  Endocrine: Negative for cold intolerance, heat intolerance and polyuria.  Genitourinary: Negative for dyspareunia, dysuria, flank pain, frequency, genital sores, hematuria, menstrual problem, pelvic pain, urgency, vaginal bleeding, vaginal discharge and vaginal pain.  Musculoskeletal: Negative for back pain, joint swelling and myalgias.  Skin: Negative for rash.  Neurological: Negative for dizziness, syncope, light-headedness, numbness and headaches.  Hematological: Negative for adenopathy.  Psychiatric/Behavioral: Negative for agitation, confusion, sleep disturbance and suicidal ideas. The patient is not nervous/anxious.      Objective: There were no vitals taken for this visit.   Physical Exam Constitutional:      Appearance: She is well-developed.  Genitourinary:     Vulva, vagina, uterus, right adnexa and left adnexa normal.     No vulval lesion or tenderness noted.     No vaginal discharge, erythema or tenderness.     No cervical motion tenderness or polyp.     Uterus is not enlarged or tender.     No right or left adnexal mass present.     Right adnexa not tender.     Left adnexa not tender.  Neck:     Thyroid: No thyromegaly.  Cardiovascular:     Rate and Rhythm: Normal rate and regular rhythm.     Heart sounds: Normal heart sounds. No murmur heard.   Pulmonary:     Effort: Pulmonary effort is normal.     Breath sounds: Normal  breath sounds.  Chest:     Breasts:        Right: No mass, nipple discharge, skin change or tenderness.        Left: No mass, nipple discharge, skin change or tenderness.  Abdominal:     Palpations: Abdomen is soft.     Tenderness: There is no abdominal tenderness. There is no guarding.  Musculoskeletal:        General: Normal range of motion.     Cervical back: Normal range of motion.  Neurological:     General: No  focal deficit present.     Mental Status: She is alert and oriented to person, place, and time.     Cranial Nerves: No cranial nerve deficit.  Skin:    General: Skin is warm and dry.  Psychiatric:        Mood and Affect: Mood normal.        Behavior: Behavior normal.        Thought Content: Thought content normal.        Judgment: Judgment normal.  Vitals reviewed.     Assessment/Plan: Encounter for annual routine gynecological examination -   Screening for breast cancer - Plan: MM 3D SCREEN BREAST BILATERAL, Pt to sched mammo  Family history of breast cancer - Plan: Pt is MyRisk neg.   Increased risk of breast cancer - Plan: Cont monthly SBE, yearly CBE and mammos. Dicsussed yearly scr breast MRI. Pt to have mammo first and call for MRI if desires by 3/21. Cont Vit D supp.   Encounter for surveillance of contraceptive pills - OCP RF.  - Plan: Levonorgestrel-Ethinyl Estradiol (DAYSEE) 0.15-0.03 &0.01 MG tablet, OCP RF to optum. Will re-eval need for OCPs next yr since has minimal spotting. Pt wants to cont this yr.   No orders of the defined types were placed in this encounter.            GYN counsel breast self exam, mammography screening, menopause, adequate intake of calcium and vitamin D, diet and exercise     F/U  No follow-ups on file.  Lorenia Hoston B. Freja Faro, PA-C 09/09/2020 2:46 PM

## 2020-09-10 ENCOUNTER — Ambulatory Visit: Payer: Managed Care, Other (non HMO) | Admitting: Obstetrics and Gynecology

## 2020-09-22 ENCOUNTER — Other Ambulatory Visit: Payer: Self-pay | Admitting: Obstetrics and Gynecology

## 2020-09-22 DIAGNOSIS — Z3041 Encounter for surveillance of contraceptive pills: Secondary | ICD-10-CM

## 2020-10-07 ENCOUNTER — Other Ambulatory Visit: Payer: Self-pay | Admitting: Obstetrics and Gynecology

## 2020-10-08 ENCOUNTER — Other Ambulatory Visit: Payer: Self-pay | Admitting: Obstetrics and Gynecology

## 2020-10-08 DIAGNOSIS — Z1231 Encounter for screening mammogram for malignant neoplasm of breast: Secondary | ICD-10-CM

## 2020-10-28 ENCOUNTER — Encounter: Payer: Self-pay | Admitting: Obstetrics and Gynecology

## 2020-10-28 ENCOUNTER — Ambulatory Visit (INDEPENDENT_AMBULATORY_CARE_PROVIDER_SITE_OTHER): Payer: Managed Care, Other (non HMO) | Admitting: Obstetrics and Gynecology

## 2020-10-28 ENCOUNTER — Other Ambulatory Visit: Payer: Self-pay

## 2020-10-28 VITALS — BP 124/70 | Ht 66.0 in | Wt 170.0 lb

## 2020-10-28 DIAGNOSIS — Z9189 Other specified personal risk factors, not elsewhere classified: Secondary | ICD-10-CM

## 2020-10-28 DIAGNOSIS — Z1151 Encounter for screening for human papillomavirus (HPV): Secondary | ICD-10-CM

## 2020-10-28 DIAGNOSIS — Z124 Encounter for screening for malignant neoplasm of cervix: Secondary | ICD-10-CM

## 2020-10-28 DIAGNOSIS — Z803 Family history of malignant neoplasm of breast: Secondary | ICD-10-CM

## 2020-10-28 DIAGNOSIS — Z3041 Encounter for surveillance of contraceptive pills: Secondary | ICD-10-CM | POA: Diagnosis not present

## 2020-10-28 DIAGNOSIS — Z1211 Encounter for screening for malignant neoplasm of colon: Secondary | ICD-10-CM

## 2020-10-28 DIAGNOSIS — Z01419 Encounter for gynecological examination (general) (routine) without abnormal findings: Secondary | ICD-10-CM

## 2020-10-28 DIAGNOSIS — Z1231 Encounter for screening mammogram for malignant neoplasm of breast: Secondary | ICD-10-CM

## 2020-10-28 MED ORDER — LEVONORGEST-ETH ESTRAD 91-DAY 0.15-0.03 &0.01 MG PO TABS: 1.0000 | ORAL_TABLET | Freq: Every day | ORAL | 3 refills | Status: DC

## 2020-10-28 NOTE — Progress Notes (Signed)
PCP:  Elby Beck, FNP   Chief Complaint  Patient presents with  . Gynecologic Exam  . [employer] Employee     HPI:      [name] is a 49 y.o. [id number] whose LMP was Patient's last menstrual period was 10/08/2020 (approximate)., presents today for her annual examination.  Her menses are Q3 months with OCPs, lasting 3 days, rarely with BTB. Dysmenorrhea none.   Sex activity: sexually active--contraception OCPs. Last Pap: November 09, 2016  Results were: no abnormalities /neg HPV DNA  Hx of STDs: none  Last mammogram: 10/16/19  Results were: normal--routine follow-up in 12 months. Has appt 11/21 There is a FH of breast cancer in her mom and pat aunt. There is a FH of ovarian cancer in her pat aunt, and colon cancer in her pat uncle. The patient does do self-breast exams. BRCA neg 6/12; neg MyRisk update testing 2/20. Riskscore=34.5%/IBIS=21%. Taking Vit D supp. Pt aware of scr breast MRI option in conjunction with yearly mammo, but hasn't had yet.  Tobacco use: The patient denies current or previous tobacco use. Alcohol use: none No drug use.  Exercise: very active  Colonoscopy: never  She does get adequate calcium and Vitamin D in her diet.  Labs with PCP/work screening.   Past Medical History:  Diagnosis Date  . Asthma   . BRCA negative 2012, 2/19   BRCA neg 2012; MyRIsk neg 2019  . Depression   . Family history of breast cancer   . Family history of ovarian cancer    6/12 BRCA Neg  . Increased risk of breast cancer 01/2018   IBIS=21%/riskscore=34.5%  . Plantar fasciitis     Past Surgical History:  Procedure Laterality Date  . LYMPH NODE BIOPSY      Family History  Problem Relation Age of Onset  . Breast cancer Mother 58  . Asthma Mother   . Cancer Mother   . Diabetes Mother   . Heart attack Mother   . Breast cancer Paternal Aunt 35  . Colon cancer Paternal Uncle 72  . Ovarian cancer Paternal Aunt 83  . Cancer Father   .  Hyperlipidemia Father   . Hypertension Father   . Asthma Brother   . Cancer Brother   . Depression Brother   . Hypertension Brother   . Learning disabilities Brother   . Miscarriages / [place] Brother   . Mental illness Brother   . Alzheimer's disease Maternal Grandmother   . Cancer Paternal Grandmother        stomach    Social History   Socioeconomic History  . Marital status: Married    Spouse name: Not on file  . Number of children: Not on file  . Years of education: Not on file  . Highest education level: Not on file  Occupational History  . Not on file  Tobacco Use  . Smoking status: Never Smoker  . Smokeless tobacco: Never Used  Vaping Use  . Vaping Use: Never used  Substance and Sexual Activity  . Alcohol use: No  . Drug use: No  . Sexual activity: Yes    Birth control/protection: Pill  Other Topics Concern  . Not on file  Social History Narrative  . Not on file   Social Determinants of Health   Financial Resource Strain:   . Difficulty of Paying Living Expenses: Not on file  Food Insecurity:   . Worried About Charity fundraiser in the Last Year: Not  on file  . Ran Out of Food in the Last Year: Not on file  Transportation Needs:   . Lack of Transportation (Medical): Not on file  . Lack of Transportation (Non-Medical): Not on file  Physical Activity:   . Days of Exercise per Week: Not on file  . Minutes of Exercise per Session: Not on file  Stress:   . Feeling of Stress : Not on file  Social Connections:   . Frequency of Communication with Friends and Family: Not on file  . Frequency of Social Gatherings with Friends and Family: Not on file  . Attends Religious Services: Not on file  . Active Member of Clubs or Organizations: Not on file  . Attends Archivist Meetings: Not on file  . Marital Status: Not on file  Intimate Partner Violence:   . Fear of Current or Ex-Partner: Not on file  . Emotionally Abused: Not on file  . Physically  Abused: Not on file  . Sexually Abused: Not on file    Current Meds  Medication Sig  . ADVAIR DISKUS 250-50 MCG/DOSE AEPB   . albuterol (VENTOLIN HFA) 108 (90 Base) MCG/ACT inhaler Inhale 2 puffs into the lungs every 6 (six) hours as needed for wheezing or shortness of breath.  . cetirizine (ZYRTEC ALLERGY) 10 MG tablet Take 1 tablet by mouth daily.  . Cholecalciferol (VITAMIN D-3) 5000 units TABS Take by mouth.  . Levonorgestrel-Ethinyl Estradiol (JAIMIESS) 0.15-0.03 &0.01 MG tablet Take 1 tablet by mouth daily.  . mometasone (ELOCON) 0.1 % ointment Apply topically.  . montelukast (SINGULAIR) 10 MG tablet   . UNABLE TO FIND Med Name: tru biotics  Once daily  . [DISCONTINUED] JAIMIESS 0.15-0.03 &0.01 MG tablet TAKE 1 TABLET BY MOUTH  DAILY     ROS:  Review of Systems  Constitutional: Negative for fatigue, fever and unexpected weight change.  Respiratory: Negative for cough, shortness of breath and wheezing.   Cardiovascular: Negative for chest pain, palpitations and leg swelling.  Gastrointestinal: Negative for blood in stool, constipation, diarrhea, nausea and vomiting.  Endocrine: Negative for cold intolerance, heat intolerance and polyuria.  Genitourinary: Negative for dyspareunia, dysuria, flank pain, frequency, genital sores, hematuria, menstrual problem, pelvic pain, urgency, vaginal bleeding, vaginal discharge and vaginal pain.  Musculoskeletal: Negative for back pain, joint swelling and myalgias.  Skin: Negative for rash.  Neurological: Negative for dizziness, syncope, light-headedness, numbness and headaches.  Hematological: Negative for adenopathy.  Psychiatric/Behavioral: Negative for agitation, confusion, sleep disturbance and suicidal ideas. The patient is not nervous/anxious.      Objective: BP 124/70   Ht '5\' 6"'  (1.676 m)   Wt 170 lb (77.1 kg)   LMP 10/08/2020 (Approximate)   BMI 27.44 kg/m    Physical Exam Constitutional:      Appearance: She is  well-developed.  Genitourinary:     Vulva, vagina, uterus, right adnexa and left adnexa normal.     No vulval lesion or tenderness noted.     No vaginal discharge, erythema or tenderness.     No cervical motion tenderness or polyp.     Uterus is not enlarged or tender.     No right or left adnexal mass present.     Right adnexa not tender.     Left adnexa not tender.  Neck:     Thyroid: No thyromegaly.  Cardiovascular:     Rate and Rhythm: Normal rate and regular rhythm.     Heart sounds: Normal heart sounds. No murmur heard.  Pulmonary:     Effort: Pulmonary effort is normal.     Breath sounds: Normal breath sounds.  Chest:     Breasts:        Right: No mass, nipple discharge, skin change or tenderness.        Left: No mass, nipple discharge, skin change or tenderness.  Abdominal:     Palpations: Abdomen is soft.     Tenderness: There is no abdominal tenderness. There is no guarding.  Musculoskeletal:        General: Normal range of motion.     Cervical back: Normal range of motion.  Neurological:     General: No focal deficit present.     Mental Status: She is alert and oriented to person, place, and time.     Cranial Nerves: No cranial nerve deficit.  Skin:    General: Skin is warm and dry.  Psychiatric:        Mood and Affect: Mood normal.        Behavior: Behavior normal.        Thought Content: Thought content normal.        Judgment: Judgment normal.  Vitals reviewed.     Assessment/Plan: Encounter for annual routine gynecological examination  Cervical cancer screening - Plan: IGP, Aptima HPV  Screening for HPV (human papillomavirus) - Plan: IGP, Aptima HPV  Encounter for surveillance of contraceptive pills - Plan: Levonorgestrel-Ethinyl Estradiol (JAIMIESS) 0.15-0.03 &0.01 MG tablet; OCP RF  Encounter for screening mammogram for malignant neoplasm of breast - Plan: MM 3D SCREEN BREAST BILATERAL; pt has mammo sched.   Family history of breast cancer--Pt  is MyRisk neg  Increased risk of breast cancer--pt aware of monthly SBE, yearly CBE and mammos, as well as scr breast MRI. Will call for ref spring 2022 if desires. Cont Vit D supp.   Screening for colon cancer - Plan: Cologuard; colonoscopy/cologuard discussed. Pt elects cologuard. Ref sent. Will f/u with results.    Meds ordered this encounter  Medications  . Levonorgestrel-Ethinyl Estradiol (JAIMIESS) 0.15-0.03 &0.01 MG tablet    Sig: Take 1 tablet by mouth daily.    Dispense:  91 tablet    Refill:  3    Requesting 1 year supply    Order Specific Question:   Supervising Provider    Answer:   Gae Dry [[id number]]             GYN counsel breast self exam, mammography screening, menopause, adequate intake of calcium and vitamin D, diet and exercise     F/U  Return in about 1 year (around 10/28/2021).  Latron Ribas B. Naser Schuld, PA-C 10/28/2020 4:32 PM

## 2020-10-28 NOTE — Patient Instructions (Signed)
I value your feedback and entrusting us with your care. If you get a [facility] patient survey, I would appreciate you taking the time to let us know about your experience today. Thank you! ° °As of November 30, 2019, your lab results will be released to your MyChart immediately, before I even have a chance to see them. Please give me time to review them and contact you if there are any abnormalities. Thank you for your patience.  ° °[facility]: [phone] ° ° ° °

## 2020-10-30 LAB — IGP, APTIMA HPV: HPV Aptima: NEGATIVE

## 2020-11-01 ENCOUNTER — Encounter: Payer: Self-pay | Admitting: Family Medicine

## 2020-11-12 ENCOUNTER — Ambulatory Visit
Admission: RE | Admit: 2020-11-12 | Discharge: 2020-11-12 | Disposition: A | Discharge status: Home / Self Care | Payer: Managed Care, Other (non HMO) | Source: Ambulatory Visit | Attending: Obstetrics and Gynecology | Admitting: Obstetrics and Gynecology

## 2020-11-12 ENCOUNTER — Other Ambulatory Visit: Payer: Self-pay

## 2020-11-12 DIAGNOSIS — Z1231 Encounter for screening mammogram for malignant neoplasm of breast: Secondary | ICD-10-CM | POA: Diagnosis not present

## 2020-12-17 ENCOUNTER — Other Ambulatory Visit: Payer: Self-pay | Admitting: Obstetrics and Gynecology

## 2020-12-17 DIAGNOSIS — Z3041 Encounter for surveillance of contraceptive pills: Secondary | ICD-10-CM

## 2021-01-02 ENCOUNTER — Other Ambulatory Visit: Payer: Self-pay | Admitting: Obstetrics and Gynecology

## 2021-01-02 ENCOUNTER — Telehealth: Payer: Self-pay

## 2021-01-02 DIAGNOSIS — Z3041 Encounter for surveillance of contraceptive pills: Secondary | ICD-10-CM

## 2021-01-02 NOTE — Telephone Encounter (Signed)
Per [facility], called pt to f/u on Cologuard test. Is she still planning on completing it? No answer, LVMTRC.

## 2021-03-31 ENCOUNTER — Other Ambulatory Visit: Payer: Self-pay | Admitting: Obstetrics and Gynecology

## 2021-03-31 DIAGNOSIS — Z3041 Encounter for surveillance of contraceptive pills: Secondary | ICD-10-CM

## 2021-04-07 MED ORDER — LEVONORGEST-ETH ESTRAD 91-DAY 0.15-0.03 &0.01 MG PO TABS: 1.0000 | ORAL_TABLET | Freq: Every day | ORAL | 2 refills | Status: DC

## 2021-04-07 NOTE — Telephone Encounter (Signed)
Pt calling needs refill of bc; denied per [facility]; UTD on appt; is on last week.  [phone] Left detailed msg refill sent to [facility]; the problem was it was sent to [facility] and therefore looked as if refill was not approp.

## 2021-04-07 NOTE — Addendum Note (Signed)
Addended by: Cleophas Dunker D on: 04/07/2021 04:55 PM   Modules accepted: Orders

## 2021-06-20 ENCOUNTER — Other Ambulatory Visit: Payer: Self-pay

## 2021-06-20 ENCOUNTER — Ambulatory Visit: Payer: Managed Care, Other (non HMO) | Admitting: Family Medicine

## 2021-06-20 ENCOUNTER — Encounter: Payer: Self-pay | Admitting: Family Medicine

## 2021-06-20 VITALS — BP 160/90 | HR 81 | Temp 97.4°F | Ht 66.0 in | Wt 181.0 lb

## 2021-06-20 DIAGNOSIS — I10 Essential (primary) hypertension: Secondary | ICD-10-CM | POA: Diagnosis not present

## 2021-06-20 MED ORDER — AMLODIPINE BESYLATE 2.5 MG PO TABS: 2.5000 mg | ORAL_TABLET | Freq: Every day | ORAL | 1 refills | Status: DC

## 2021-06-20 NOTE — Patient Instructions (Signed)
Go to the lab on the way out.   If you have mychart we'll likely use that to update you.    Amlodipine 2.5mg  a day.  Update me about your BP in about 10 days.  Limit salt.  Drink enough water to keep your urine clear.  Omron cuffs are good.  Take care.  Glad to see you.

## 2021-06-20 NOTE — Progress Notes (Signed)
This visit occurred during the SARS-CoV-2 public health emergency.  Safety protocols were in place, including screening questions prior to the visit, additional usage of staff PPE, and extensive cleaning of exam room while observing appropriate contact time as indicated for disinfecting solutions.  Went for eye clinic eval.  BP elevated there.  Didn't have h/o HTN years prior.  Was dx'd with glaucoma, started on eye drops in the meantime.  She has f/u pending.    Hypertension:    No meds.   Chest pain with exertion:no Edema:no Short of breath:no Labs pending.    FH HTN- father.    Meds, vitals, and allergies reviewed.   ROS: Per HPI unless specifically indicated in ROS section   GEN: nad, alert and oriented HEENT: ncat NECK: supple w/o LA CV: rrr.  no murmur PULM: ctab, no inc wob ABD: soft, +bs EXT: no edema SKIN: Well-perfused.  Labs pending.

## 2021-06-21 LAB — BASIC METABOLIC PANEL
BUN/Creatinine Ratio: 20 (ref 9–23)
BUN: 14 mg/dL (ref 6–24)
CO2: 19 mmol/L — ABNORMAL LOW (ref 20–29)
Calcium: 9.5 mg/dL (ref 8.7–10.2)
Chloride: 102 mmol/L (ref 96–106)
Creatinine, Ser: 0.71 mg/dL (ref 0.57–1.00)
Glucose: 88 mg/dL (ref 65–99)
Potassium: 3.9 mmol/L (ref 3.5–5.2)
Sodium: 135 mmol/L (ref 134–144)
eGFR: 104 mL/min/{1.73_m2} (ref >59)

## 2021-06-21 LAB — CBC WITH DIFFERENTIAL/PLATELET
Basophils Absolute: 0 10*3/uL (ref 0.0–0.2)
Basos: 1 %
EOS (ABSOLUTE): 0.1 10*3/uL (ref 0.0–0.4)
Eos: 2 %
Hematocrit: 40.6 % (ref 34.0–46.6)
Hemoglobin: 13.8 g/dL (ref 11.1–15.9)
Immature Grans (Abs): 0 10*3/uL (ref 0.0–0.1)
Immature Granulocytes: 0 %
Lymphocytes Absolute: 2.6 10*3/uL (ref 0.7–3.1)
Lymphs: 37 %
MCH: 30.7 pg (ref 26.6–33.0)
MCHC: 34 g/dL (ref 31.5–35.7)
MCV: 90 fL (ref 79–97)
Monocytes Absolute: 0.6 10*3/uL (ref 0.1–0.9)
Monocytes: 8 %
Neutrophils Absolute: 3.8 10*3/uL (ref 1.4–7.0)
Neutrophils: 52 %
Platelets: 283 10*3/uL (ref 150–450)
RBC: 4.49 x10E6/uL (ref 3.77–5.28)
RDW: 11.4 % — ABNORMAL LOW (ref 11.7–15.4)
WBC: 7.2 10*3/uL (ref 3.4–10.8)

## 2021-06-21 LAB — TSH: TSH: 2.38 u[IU]/mL (ref 0.450–4.500)

## 2021-06-23 DIAGNOSIS — I10 Essential (primary) hypertension: Secondary | ICD-10-CM | POA: Insufficient documentation

## 2021-06-23 NOTE — Assessment & Plan Note (Signed)
Okay for outpatient follow-up Amlodipine 2.5mg  a day.  Routine cautions given to patient. I asked her to update me about her BP in about 10 days.  Limit salt.  Drink enough water to keep your urine clear.  She can check blood pressure at home, discussed with patient about getting a cuff.  Routine instructions given to patient.

## 2021-07-11 ENCOUNTER — Encounter: Payer: Self-pay | Admitting: Family Medicine

## 2021-07-31 ENCOUNTER — Other Ambulatory Visit: Payer: Self-pay

## 2021-07-31 ENCOUNTER — Ambulatory Visit (INDEPENDENT_AMBULATORY_CARE_PROVIDER_SITE_OTHER): Payer: Managed Care, Other (non HMO) | Admitting: Family Medicine

## 2021-07-31 VITALS — BP 118/78 | HR 80 | Temp 98.0°F | Ht 65.75 in | Wt 179.8 lb

## 2021-07-31 DIAGNOSIS — Z0000 Encounter for general adult medical examination without abnormal findings: Secondary | ICD-10-CM | POA: Diagnosis not present

## 2021-07-31 DIAGNOSIS — Z114 Encounter for screening for human immunodeficiency virus [HIV]: Secondary | ICD-10-CM

## 2021-07-31 DIAGNOSIS — I10 Essential (primary) hypertension: Secondary | ICD-10-CM | POA: Diagnosis not present

## 2021-07-31 DIAGNOSIS — E66811 Obesity, class 1: Secondary | ICD-10-CM | POA: Insufficient documentation

## 2021-07-31 DIAGNOSIS — Z6827 Body mass index (BMI) 27.0-27.9, adult: Secondary | ICD-10-CM | POA: Insufficient documentation

## 2021-07-31 DIAGNOSIS — E663 Overweight: Secondary | ICD-10-CM | POA: Diagnosis not present

## 2021-07-31 DIAGNOSIS — Z6829 Body mass index (BMI) 29.0-29.9, adult: Secondary | ICD-10-CM | POA: Insufficient documentation

## 2021-07-31 DIAGNOSIS — Z6828 Body mass index (BMI) 28.0-28.9, adult: Secondary | ICD-10-CM | POA: Insufficient documentation

## 2021-07-31 DIAGNOSIS — Z1159 Encounter for screening for other viral diseases: Secondary | ICD-10-CM

## 2021-07-31 DIAGNOSIS — R7303 Prediabetes: Secondary | ICD-10-CM

## 2021-07-31 DIAGNOSIS — H409 Unspecified glaucoma: Secondary | ICD-10-CM | POA: Insufficient documentation

## 2021-07-31 DIAGNOSIS — Z1211 Encounter for screening for malignant neoplasm of colon: Secondary | ICD-10-CM

## 2021-07-31 NOTE — Patient Instructions (Addendum)
Here is what I would recommend - for weight loss:  1) Increase the amount of water you drink a day > specifically drink a glass of water 8 oz or more before every meal 2) Could start a fiber supplement (like metamucil) > You could take this up to 3 times a day, but I would start with 1 time a day until you get used to it. It may cause some stomach upset 3) Make sure you sit down to eat and eat slowly (cut meat one piece at a time) > specifically train your body to eat only at the table (avoiding snacking in front of the TV) 4) Fill up on healthy items first > consider eating a salad with low calorie salad dressing before every meal  5) Make 1/2 of your plate vegetables 6) Keep healthy snacks available for those times when you are bored 7) Consider using a calorie counting app like MyFitnessPal > counting calories helps you make wise choices around snacking. Or if you can afford it you could sign up for Weight Watchers -- and learn about healthy options through a point system   #Referral I have placed a referral to a specialist for you. You should receive a phone call from the specialty office. Make sure your voicemail is not full and that if you are able to answer your phone to unknown or new numbers.   It may take up to 2 weeks to hear about the referral. If you do not hear anything in 2 weeks, please call our office and ask to speak with the referral coordinator.

## 2021-07-31 NOTE — Assessment & Plan Note (Signed)
Controlled on amlodipine. Discussed considering changing from estrogen based birth control.

## 2021-07-31 NOTE — Assessment & Plan Note (Signed)
Healthy diet/exercise/weight loss. F/u with new pcp in 6 months

## 2021-07-31 NOTE — Progress Notes (Signed)
Annual Exam   Chief Complaint:  Chief Complaint  Patient presents with   Annual Exam    History of Present Illness:  [name] is a 50 y.o. [id number] who LMP was Patient's last menstrual period was 07/02/2021 (within days)., presents today for her annual examination.     Nutrition Diet: some days does really well, other days where she gets fast food Exercise: not currently, just joined a gym She does get adequate calcium and Vitamin D in her diet.   Social History   Tobacco Use  Smoking Status Never  Smokeless Tobacco Never   Social History   Substance and Sexual Activity  Alcohol Use No   Social History   Substance and Sexual Activity  Drug Use No    Safety The patient wears seatbelts: yes.     The patient feels safe at home and in their relationships: yes.  General Health Dentist in the last year: Yes Eye doctor: yes  Menstrual On 54 day birth control, so only every 90 days   GYN She is single partner, contraception - OCP (estrogen/progesterone).    Cervical Cancer Screening:   Last Pap:   November 2021 Results were: no abnormalities /neg HPV DNA    Breast Cancer Screening There is no FH of breast cancer. There is no FH of ovarian cancer. BRCA screening completed and negative.  Discussed that for average risk women between age 59-49 screening may reduce the risk of breast cancer death, however, at a lower rate than those over age 77. And that the the false-positive rates resulting in unnecessary biopsies with more screening is higher. The balance of benefits vs harms likely improves as you progress through your 40s. The patient does want a mammogram this year.   Colon Cancer Screening:  Age 7-75 yo - benefits outweigh the risk. Adults 85-85 yo who have never been screened benefit.  Benefits: 134000 people in 2016 will be diagnosed and 49,000 will die - early detection helps Harms: Complications 2/2 to colonoscopy High Risk (Colonoscopy):  genetic disorder (Lynch syndrome or familial adenomatous polyposis), personal hx of IBD, previous adenomatous polyp, or previous colorectal cancer, FamHx start 10 years before the age at diagnosis, increased in males and black race  Options:  FIT - looks for hemoglobin (blood in the stool) - specific and fairly sensitive - must be done annually Cologuard - looks for DNA and blood - more sensitive - therefore can have more false positives, every 3 years Colonoscopy - every 10 years if normal - sedation, bowl prep, must have someone drive you  Shared decision making and the patient had decided to do colonoscopy.  Weight Wt Readings from Last 3 Encounters:  07/31/21 179 lb 12 oz (81.5 kg)  06/20/21 181 lb (82.1 kg)  10/28/20 170 lb (77.1 kg)   Patient has high BMI  BMI Readings from Last 1 Encounters:  07/31/21 29.23 kg/m     Chronic disease screening Blood pressure monitoring:  BP Readings from Last 3 Encounters:  07/31/21 118/78  06/20/21 (!) 160/90  10/28/20 124/70    Lipid Monitoring: Indication for screening: age >2, obesity, diabetes, family hx, CV risk factors.  Lipid screening: Yes  Lab Results  Component Value Date   CHOL 169 10/04/2019   HDL 40 10/04/2019   LDLCALC 103 (H) 10/04/2019   TRIG 147 10/04/2019   CHOLHDL 4.2 10/04/2019     Diabetes Screening: age >67, overweight, family hx, PCOS, hx of gestational diabetes, at risk ethnicity  Diabetes Screening screening: Yes  Lab Results  Component Value Date   HGBA1C 5.5 10/04/2019     Past Medical History:  Diagnosis Date   Asthma    BRCA negative 2012, 2/19   BRCA neg 2012; MyRIsk neg 2019   Depression    Family history of breast cancer    Family history of ovarian cancer    6/12 BRCA Neg   Increased risk of breast cancer 01/2018   IBIS=21%/riskscore=34.5%   Plantar fasciitis     Past Surgical History:  Procedure Laterality Date   LYMPH NODE BIOPSY      Prior to Admission medications    Medication Sig Start Date End Date Taking? Authorizing Provider  albuterol (VENTOLIN HFA) 108 (90 Base) MCG/ACT inhaler Inhale 2 puffs into the lungs every 6 (six) hours as needed for wheezing or shortness of breath. 02/15/20  Yes Montine Circle, PA-C  amLODipine (NORVASC) 2.5 MG tablet Take 1 tablet (2.5 mg total) by mouth daily. 06/20/21  Yes Tonia Ghent, MD  cetirizine (ZYRTEC) 10 MG tablet Take 1 tablet by mouth daily.   Yes [provider]  Cholecalciferol (VITAMIN D-3) 5000 units TABS Take by mouth.   Yes [provider]  Levonorgestrel-Ethinyl Estradiol (JAIMIESS) 0.15-0.03 &0.01 MG tablet Take 1 tablet by mouth daily. 02/27/39  Yes Copland, Alicia B, PA-C  mometasone (ELOCON) 0.1 % ointment Apply topically. 10/25/20  Yes [provider]  montelukast (SINGULAIR) 10 MG tablet Take 10 mg by mouth at bedtime. 10/06/16  Yes [provider]  Multiple Vitamin (MULTIVITAMIN ADULT) TABS See admin instructions.   Yes [provider]  SIMBRINZA 1-0.2 % SUSP Apply 1 drop to eye 2 (two) times daily. 07/02/21  Yes [provider]  TRELEGY ELLIPTA 100-62.5-25 MCG/INH AEPB Inhale 1 puff into the lungs daily. 07/04/21  Yes [provider]    Allergies  Allergen Reactions   Iodinated Diagnostic Agents Hives   Other Hives    Seasonal, cats, dogs   Penicillins Hives    Gynecologic History: Patient's last menstrual period was 07/02/2021 (within days).  Obstetric History: G2P2002  Social History   Socioeconomic History   Marital status: Married    Spouse name: Not on file   Number of children: Not on file   Years of education: Not on file   Highest education level: Not on file  Occupational History   Not on file  Tobacco Use   Smoking status: Never   Smokeless tobacco: Never  Vaping Use   Vaping Use: Never used  Substance and Sexual Activity   Alcohol use: No   Drug use: No   Sexual activity: Yes    Birth  control/protection: Pill  Other Topics Concern   Not on file  Social History Narrative   Works at [employer], Conservation officer, historic buildings.     Social Determinants of Health   Financial Resource Strain: Not on file  Food Insecurity: Not on file  Transportation Needs: Not on file  Physical Activity: Not on file  Stress: Not on file  Social Connections: Not on file  Intimate Partner Violence: Not on file    Family History  Problem Relation Age of Onset   Breast cancer Mother 42   Asthma Mother    Cancer Mother    Diabetes Mother    Heart attack Mother    Breast cancer Paternal Aunt 83   Colon cancer Paternal Uncle 10   Ovarian cancer Paternal Aunt 17   Cancer Father  Hyperlipidemia Father    Hypertension Father    Asthma Brother    Cancer Brother    Depression Brother    Hypertension Brother    Learning disabilities Brother    Miscarriages / [place] Brother    Mental illness Brother    Alzheimer's disease Maternal Grandmother    Cancer Paternal Grandmother        stomach    Review of Systems  Constitutional:  Negative for chills and fever.  HENT:  Negative for congestion and sore throat.   Eyes:  Negative for blurred vision and double vision.  Respiratory:  Negative for shortness of breath.   Cardiovascular:  Negative for chest pain.  Gastrointestinal:  Negative for heartburn, nausea and vomiting.  Genitourinary: Negative.   Musculoskeletal: Negative.  Negative for myalgias.  Skin:  Negative for rash.  Neurological:  Negative for dizziness and headaches.  Endo/Heme/Allergies:  Does not bruise/bleed easily.  Psychiatric/Behavioral:  Negative for depression. The patient is not nervous/anxious.     Physical Exam BP 118/78   Pulse 80   Temp 98 F (36.7 C) (Temporal)   Ht 5' 5.75" (1.67 m)   Wt 179 lb 12 oz (81.5 kg)   LMP 07/02/2021 (Within Days)   SpO2 98%   BMI 29.23 kg/m    BP Readings from Last 3 Encounters:  07/31/21 118/78  06/20/21 (!) 160/90  10/28/20  124/70      Physical Exam Constitutional:      General: She is not in acute distress.    Appearance: She is well-developed. She is not diaphoretic.  HENT:     Head: Normocephalic and atraumatic.     Right Ear: External ear normal.     Left Ear: External ear normal.     Nose: Nose normal.  Eyes:     General: No scleral icterus.    Extraocular Movements: Extraocular movements intact.     Conjunctiva/sclera: Conjunctivae normal.  Cardiovascular:     Rate and Rhythm: Normal rate and regular rhythm.     Heart sounds: No murmur heard. Pulmonary:     Effort: Pulmonary effort is normal. No respiratory distress.     Breath sounds: Normal breath sounds. No wheezing.  Abdominal:     General: Bowel sounds are normal. There is no distension.     Palpations: Abdomen is soft. There is no mass.     Tenderness: There is no abdominal tenderness. There is no guarding or rebound.  Musculoskeletal:        General: Normal range of motion.     Cervical back: Neck supple.  Lymphadenopathy:     Cervical: No cervical adenopathy.  Skin:    General: Skin is warm and dry.     Capillary Refill: Capillary refill takes less than 2 seconds.  Neurological:     Mental Status: She is alert and oriented to person, place, and time.     Deep Tendon Reflexes: Reflexes normal.  Psychiatric:        Mood and Affect: Mood normal.        Behavior: Behavior normal.     Results:  PHQ-9:  [place] Office Visit from 07/31/2021 in [place] at [place]  PHQ-9 Total Score 2      Depression screen [facility] 2/9 07/31/2021 10/11/2019 12/13/2018  Decreased Interest 0 0 0  Down, Depressed, Hopeless 0 0 0  PHQ - 2 Score 0 0 0  Altered sleeping 1 - -  Tired, decreased energy 1 - -  Change in appetite 0 - -  Feeling bad or failure about yourself  0 - -  Trouble concentrating 0 - -  Moving slowly or fidgety/restless 0 - -  Suicidal thoughts 0 - -  PHQ-9 Score 2 - -  Difficult doing work/chores Not  difficult at all - -       Assessment: 50 y.o. [id number] female here for routine annual physical examination.  Plan: Problem List Items Addressed This Visit       Cardiovascular and Mediastinum   Hypertension    Controlled on amlodipine. Discussed considering changing from estrogen based birth control.       Relevant Orders   Lipid panel   Hepatitis C antibody   HIV Antibody (routine testing w rflx)     Other   Overweight (BMI 25.0-29.9)   Relevant Orders   Ambulatory referral to Nutrition and Diabetic Education   Lipid panel   Hepatitis C antibody   HIV Antibody (routine testing w rflx)   Prediabetes    Healthy diet/exercise/weight loss. F/u with new pcp in 6 months      Relevant Orders   Lipid panel   Hepatitis C antibody   HIV Antibody (routine testing w rflx)   Other Visit Diagnoses     Annual physical exam    -  Primary   Relevant Orders   Lipid panel   Hepatitis C antibody   HIV Antibody (routine testing w rflx)   Colon cancer screening       Relevant Orders   Ambulatory referral to gastroenterology for colonoscopy   Lipid panel   Hepatitis C antibody   HIV Antibody (routine testing w rflx)   Need for hepatitis C screening test       Relevant Orders   Hepatitis C antibody   Encounter for screening for HIV       Relevant Orders   HIV Antibody (routine testing w rflx)       Screening: -- Blood pressure screen normal -- cholesterol screening: will obtain -- Weight screening: overweight: continue to monitor -- Diabetes Screening:  prediabetes per work testing -- Nutrition: Encouraged healthy diet  The 10-year ASCVD risk score Mikey Bussing DC Jr., et al., 2013) is: 1.6%   Values used to calculate the score:     Age: 33 years     Sex: Female     Is Non-Hispanic African American: No     Diabetic: No     Tobacco smoker: No     Systolic Blood Pressure: 601 mmHg     Is BP treated: Yes     HDL Cholesterol: 40 mg/dL     Total Cholesterol: 169 mg/dL  --  Statin therapy for Age 41-75 with CVD risk >7.5%  Psych -- Depression screening (PHQ-9):  [place] Visit from 07/31/2021 in [place] at [place]  PHQ-9 Total Score 2      Depression screen [facility] 2/9 07/31/2021 10/11/2019 12/13/2018  Decreased Interest 0 0 0  Down, Depressed, Hopeless 0 0 0  PHQ - 2 Score 0 0 0  Altered sleeping 1 - -  Tired, decreased energy 1 - -  Change in appetite 0 - -  Feeling bad or failure about yourself  0 - -  Trouble concentrating 0 - -  Moving slowly or fidgety/restless 0 - -  Suicidal thoughts 0 - -  PHQ-9 Score 2 - -  Difficult doing work/chores Not difficult at all - -      Safety --  tobacco screening: not using -- alcohol screening:  low-risk usage. -- no evidence of domestic violence or intimate partner violence.   Cancer Screening -- pap smear not collected per ASCCP guidelines -- family history of breast cancer screening: done. not at high risk. -- Mammogram -  will get with GYN -- Colon cancer (age 7+)--  referral to GI  Immunizations Immunization History  Administered Date(s) Administered   Influenza Split 08/21/2014   Influenza,inj,Quad PF,6+ Mos 09/09/2018, 10/11/2019   Tdap 01/09/2015    -- flu vaccine up to date -- TDAP q10 years up to date -- Covid-19 Vaccine unknown, record requested   Encouraged healthy diet and exercise. Encouraged regular vision and dental care.   Lesleigh Noe, MD

## 2021-08-01 LAB — LIPID PANEL
Chol/HDL Ratio: 5 ratio — ABNORMAL HIGH (ref 0.0–4.4)
Cholesterol, Total: 226 mg/dL — ABNORMAL HIGH (ref 100–199)
HDL: 45 mg/dL (ref >39)
LDL Chol Calc (NIH): 135 mg/dL — ABNORMAL HIGH (ref 0–99)
Triglycerides: 259 mg/dL — ABNORMAL HIGH (ref 0–149)
VLDL Cholesterol Cal: 46 mg/dL — ABNORMAL HIGH (ref 5–40)

## 2021-08-01 LAB — HEPATITIS C ANTIBODY: Hep C Virus Ab: <0.1 s/co ratio (ref 0.0–0.9)

## 2021-08-01 LAB — HIV ANTIBODY (ROUTINE TESTING W REFLEX): HIV Screen 4th Generation wRfx: NONREACTIVE

## 2021-08-29 ENCOUNTER — Telehealth: Payer: Self-pay | Admitting: Family Medicine

## 2021-09-01 MED ORDER — AMLODIPINE BESYLATE 2.5 MG PO TABS: 2.5000 mg | ORAL_TABLET | Freq: Every day | ORAL | 1 refills | Status: DC

## 2021-09-01 NOTE — Telephone Encounter (Signed)
Left message for patient to call back to discuss if patient wants to [facility] to [place] or [place].  Amlodipine last refilled on 06/20/21 #90 with 0 refill. Last office visit on 07/31/21 CPE with Dr Einar Pheasant.  No future appointments on file.

## 2021-09-10 NOTE — Telephone Encounter (Signed)
error 

## 2021-10-25 ENCOUNTER — Other Ambulatory Visit: Payer: Self-pay | Admitting: Obstetrics and Gynecology

## 2021-10-25 DIAGNOSIS — Z3041 Encounter for surveillance of contraceptive pills: Secondary | ICD-10-CM

## 2021-10-28 LAB — COLOGUARD: Cologuard: NEGATIVE

## 2021-11-05 ENCOUNTER — Ambulatory Visit: Payer: Managed Care, Other (non HMO) | Admitting: Obstetrics and Gynecology

## 2021-11-09 ENCOUNTER — Telehealth: Payer: Managed Care, Other (non HMO) | Admitting: Family

## 2021-11-09 DIAGNOSIS — R6889 Other general symptoms and signs: Secondary | ICD-10-CM | POA: Diagnosis not present

## 2021-11-09 DIAGNOSIS — B370 Candidal stomatitis: Secondary | ICD-10-CM | POA: Diagnosis not present

## 2021-11-09 MED ORDER — NYSTATIN 100000 UNIT/ML MT SUSP: 5.0000 mL | Freq: Four times a day (QID) | OROMUCOSAL | 0 refills | Status: DC

## 2021-11-09 NOTE — Progress Notes (Signed)
Virtual Visit Consent   Nicole Harrell, you are scheduled for a virtual visit with a [facility] provider today.     Just as with appointments in the office, your consent must be obtained to participate.  Your consent will be active for this visit and any virtual visit you may have with one of our providers in the next 365 days.     If you have a MyChart account, a copy of this consent can be sent to you electronically.  All virtual visits are billed to your insurance company just like a traditional visit in the office.    As this is a virtual visit, video technology does not allow for your provider to perform a traditional examination.  This may limit your provider's ability to fully assess your condition.  If your provider identifies any concerns that need to be evaluated in person or the need to arrange testing (such as labs, EKG, etc.), we will make arrangements to do so.     Although advances in technology are sophisticated, we cannot ensure that it will always work on either your end or our end.  If the connection with a video visit is poor, the visit may have to be switched to a telephone visit.  With either a video or telephone visit, we are not always able to ensure that we have a secure connection.     I need to obtain your verbal consent now.   Are you willing to proceed with your visit today?    Nicole Harrell has provided verbal consent on 11/09/2021 for a virtual visit (video or telephone).   Nicole Dun, FNP   Date: 11/09/2021 7:57 AM   Virtual Visit via Video Note   I, Nicole Harrell, connected with  Nicole Harrell  ([id number], 1971-03-08) on 11/09/21 at  7:45 AM EST by a video-enabled telemedicine application and verified that I am speaking with the correct person using two identifiers.  Location: Patient: Virtual Visit Location Patient: Home Provider: Virtual Visit Location Provider: Home Office   I discussed the limitations of evaluation and  management by telemedicine and the availability of in person appointments. The patient expressed understanding and agreed to proceed.    History of Present Illness: Nicole Harrell is a 50 y.o. who identifies as a female who was assigned female at birth, and is being seen today for sore throat and body aches that started two days ago.  HPI: Influenza The current episode started in the past 7 days. The problem occurs intermittently. Associated symptoms include arthralgias, congestion, coughing, fatigue, headaches, myalgias, a sore throat and weakness. Pertinent negatives include no urinary symptoms, visual change or vomiting. She has tried acetaminophen and rest for the symptoms. The treatment provided mild relief.   Problems:  Patient Active Problem List   Diagnosis Date Noted   Glaucoma 07/31/2021   Overweight (BMI 25.0-29.9) 07/31/2021   Prediabetes 07/31/2021   Hypertension 06/23/2021   Increased risk of breast cancer 06/21/2019   Family history of breast cancer 06/21/2019   Allergic rhinitis 11/23/2016   GERD (gastroesophageal reflux disease) 11/23/2016   Reactive airway disease 11/23/2016   Reactive cervical lymphadenopathy 11/23/2016    Allergies:  Allergies  Allergen Reactions   Iodinated Diagnostic Agents Hives   Other Hives    Seasonal, cats, dogs   Penicillins Hives   Medications:  Current Outpatient Medications:    nystatin (MYCOSTATIN) 100000 UNIT/ML suspension, Take 5 mLs (500,000 Units total) by mouth 4 (four) times  daily., Disp: 473 mL, Rfl: 0   albuterol (VENTOLIN HFA) 108 (90 Base) MCG/ACT inhaler, Inhale 2 puffs into the lungs every 6 (six) hours as needed for wheezing or shortness of breath., Disp: 16 g, Rfl: 0   amLODipine (NORVASC) 2.5 MG tablet, Take 1 tablet (2.5 mg total) by mouth daily., Disp: 90 tablet, Rfl: 1   cetirizine (ZYRTEC) 10 MG tablet, Take 1 tablet by mouth daily., Disp: , Rfl:    Cholecalciferol (VITAMIN D-3) 5000 units TABS, Take by  mouth., Disp: , Rfl:    JAIMIESS 0.15-0.03 &0.01 MG tablet, TAKE 1 TABLET BY MOUTH  DAILY, Disp: 91 tablet, Rfl: 0   mometasone (ELOCON) 0.1 % ointment, Apply topically., Disp: , Rfl:    montelukast (SINGULAIR) 10 MG tablet, Take 10 mg by mouth at bedtime., Disp: , Rfl:    Multiple Vitamin (MULTIVITAMIN ADULT) TABS, See admin instructions., Disp: , Rfl:    SIMBRINZA 1-0.2 % SUSP, Apply 1 drop to eye 2 (two) times daily., Disp: , Rfl:   Observations/Objective: Patient is well-developed, well-nourished in no acute distress.  Resting comfortably  at home.  Head is normocephalic, atraumatic.  No labored breathing.  Speech is clear and coherent with logical content.  Patient is alert and oriented at baseline.  Intermittent throat clearing   Assessment and Plan: 1. Flu-like symptoms  2. Oral thrush - nystatin (MYCOSTATIN) 100000 UNIT/ML suspension; Take 5 mLs (500,000 Units total) by mouth 4 (four) times daily.  Dispense: 473 mL; Refill: 0  Discussed given her symptoms, it sounds like flu like. She requesting antibiotic for sinus pressure. We will hold off at this time. If symptoms worsen, she will send [place] a message and we can send in Doxycyline.  Force fluids Rest Alternate tylenol and motrin Mucinex as needed   Follow Up Instructions: I discussed the assessment and treatment plan with the patient. The patient was provided an opportunity to ask questions and all were answered. The patient agreed with the plan and demonstrated an understanding of the instructions.  A copy of instructions were sent to the patient via MyChart unless otherwise noted below.     The patient was advised to call back or seek an in-person evaluation if the symptoms worsen or if the condition fails to improve as anticipated.  Time:  I spent 15 minutes with the patient via telehealth technology discussing the above problems/concerns.    Nicole Dun, FNP

## 2021-11-09 NOTE — Patient Instructions (Addendum)

## 2021-11-11 ENCOUNTER — Other Ambulatory Visit: Payer: Self-pay | Admitting: Family

## 2021-11-11 ENCOUNTER — Telehealth: Payer: Managed Care, Other (non HMO) | Admitting: Physician Assistant

## 2021-11-11 DIAGNOSIS — R051 Acute cough: Secondary | ICD-10-CM | POA: Diagnosis not present

## 2021-11-11 DIAGNOSIS — H1032 Unspecified acute conjunctivitis, left eye: Secondary | ICD-10-CM | POA: Diagnosis not present

## 2021-11-11 DIAGNOSIS — R0981 Nasal congestion: Secondary | ICD-10-CM | POA: Diagnosis not present

## 2021-11-11 MED ORDER — POLYMYXIN B-TRIMETHOPRIM 10000-0.1 UNIT/ML-% OP SOLN: 1.0000 [drp] | OPHTHALMIC | 0 refills | Status: DC

## 2021-11-11 MED ORDER — BENZONATATE 100 MG PO CAPS: 100.0000 mg | ORAL_CAPSULE | Freq: Three times a day (TID) | ORAL | 0 refills | Status: DC | PRN

## 2021-11-11 MED ORDER — PREDNISONE 20 MG PO TABS: 20.0000 mg | ORAL_TABLET | Freq: Every day | ORAL | 0 refills | Status: DC

## 2021-11-11 NOTE — Patient Instructions (Signed)
Nicole Harrell, thank you for joining Nicole Daring, PA-C for today's virtual visit.  While this provider is not your primary care provider (PCP), if your PCP is located in our provider database this encounter information will be shared with them immediately following your visit.  Consent: (Patient) Nicole Harrell provided verbal consent for this virtual visit at the beginning of the encounter.  Current Medications:  Current Outpatient Medications:    benzonatate (TESSALON) 100 MG capsule, Take 1 capsule (100 mg total) by mouth 3 (three) times daily as needed., Disp: 30 capsule, Rfl: 0   predniSONE (DELTASONE) 20 MG tablet, Take 1 tablet (20 mg total) by mouth daily with breakfast., Disp: 7 tablet, Rfl: 0   trimethoprim-polymyxin b (POLYTRIM) ophthalmic solution, Place 1 drop into the left eye every 4 (four) hours., Disp: 10 mL, Rfl: 0   albuterol (VENTOLIN HFA) 108 (90 Base) MCG/ACT inhaler, Inhale 2 puffs into the lungs every 6 (six) hours as needed for wheezing or shortness of breath., Disp: 16 g, Rfl: 0   amLODipine (NORVASC) 2.5 MG tablet, Take 1 tablet (2.5 mg total) by mouth daily., Disp: 90 tablet, Rfl: 1   cetirizine (ZYRTEC) 10 MG tablet, Take 1 tablet by mouth daily., Disp: , Rfl:    Cholecalciferol (VITAMIN D-3) 5000 units TABS, Take by mouth., Disp: , Rfl:    JAIMIESS 0.15-0.03 &0.01 MG tablet, TAKE 1 TABLET BY MOUTH  DAILY, Disp: 91 tablet, Rfl: 0   mometasone (ELOCON) 0.1 % ointment, Apply topically., Disp: , Rfl:    montelukast (SINGULAIR) 10 MG tablet, Take 10 mg by mouth at bedtime., Disp: , Rfl:    Multiple Vitamin (MULTIVITAMIN ADULT) TABS, See admin instructions., Disp: , Rfl:    nystatin (MYCOSTATIN) 100000 UNIT/ML suspension, Take 5 mLs (500,000 Units total) by mouth 4 (four) times daily., Disp: 473 mL, Rfl: 0   SIMBRINZA 1-0.2 % SUSP, Apply 1 drop to eye 2 (two) times daily., Disp: , Rfl:    Medications ordered in this encounter:  Meds ordered this  encounter  Medications   trimethoprim-polymyxin b (POLYTRIM) ophthalmic solution    Sig: Place 1 drop into the left eye every 4 (four) hours.    Dispense:  10 mL    Refill:  0    Order Specific Question:   Supervising Provider    Answer:   MILLER, BRIAN [[id number]]   predniSONE (DELTASONE) 20 MG tablet    Sig: Take 1 tablet (20 mg total) by mouth daily with breakfast.    Dispense:  7 tablet    Refill:  0    Order Specific Question:   Supervising Provider    Answer:   MILLER, BRIAN [[id number]]   benzonatate (TESSALON) 100 MG capsule    Sig: Take 1 capsule (100 mg total) by mouth 3 (three) times daily as needed.    Dispense:  30 capsule    Refill:  0    Order Specific Question:   Supervising Provider    Answer:   Sabra Heck, [place]     *If you need refills on other medications prior to your next appointment, please contact your pharmacy*  Follow-Up: Call back or seek an in-person evaluation if the symptoms worsen or if the condition fails to improve as anticipated.  Other Instructions Bacterial Conjunctivitis, Adult Bacterial conjunctivitis is an infection of your conjunctiva. This is the clear membrane that covers the white part of your eye and the inner part of your eyelid. This infection can make your  eye: Red or pink. Itchy or irritated. This condition spreads easily from person to person (is contagious) and from one eye to the other eye. What are the causes? This condition is caused by germs (bacteria). You may get the infection if you come into close contact with: A person who has the infection. Items that have germs on them (are contaminated), such as face towels, contact lens solution, or eye makeup. What increases the risk? You are more likely to get this condition if: You have contact with people who have the infection. You wear contact lenses. You have a sinus infection. You have had a recent eye injury or surgery. You have a weak body defense system (immune system). You  have dry eyes. What are the signs or symptoms?  Thick, yellowish discharge from the eye. Tearing or watery eyes. Itchy eyes. Burning feeling in your eyes. Eye redness. Swollen eyelids. Blurred vision. How is this treated?  Antibiotic eye drops or ointment. Antibiotic medicine taken by mouth. This is used for infections that do not get better with drops or ointment or that last more than 10 days. Cool, wet cloths placed on the eyes. Artificial tears used 2-6 times a day. Follow these instructions at home: Medicines Take or apply your antibiotic medicine as told by your doctor. Do not stop using it even if you start to feel better. Take or apply over-the-counter and prescription medicines only as told by your doctor. Do not touch your eyelid with the eye-drop bottle or the ointment tube. Managing discomfort Wipe any fluid from your eye with a warm, wet washcloth or a cotton ball. Place a clean, cool, wet cloth on your eye. Do this for 10-20 minutes, 3-4 times a day. General instructions Do not wear contacts until the infection is gone. Wear glasses until your doctor says it is okay to wear contacts again. Do not wear eye makeup until the infection is gone. Throw away old eye makeup. Change or wash your pillowcase every day. Do not share towels or washcloths. Wash your hands often with soap and water for at least 20 seconds and especially before touching your face or eyes. Use paper towels to dry your hands. Do not touch or rub your eyes. Do not drive or use heavy machinery if your vision is blurred. Contact a doctor if: You have a fever. You do not get better after 10 days. Get help right away if: You have a fever and your symptoms get worse all of a sudden. You have very bad pain when you move your eye. Your face: Hurts. Is red. Is swollen. You have sudden loss of vision. Summary Bacterial conjunctivitis is an infection of your conjunctiva. This infection spreads easily  from person to person. Wash your hands often with soap and water for at least 20 seconds and especially before touching your face or eyes. Use paper towels to dry your hands. Take or apply your antibiotic medicine as told by your doctor. Contact a doctor if you have a fever or you do not get better after 10 days. This information is not intended to replace advice given to you by your health care provider. Make sure you discuss any questions you have with your health care provider. Document Revised: 03/19/2021 Document Reviewed: 03/19/2021 Elsevier Patient Education  2022 [employer].    If you have been instructed to have an in-person evaluation today at a local Urgent Care facility, please use the link below. It will take you to a list  of all of our available [facility], including address, phone number and hours of operation. Please do not delay care.  [facility]  If you or a family member do not have a primary care provider, use the link below to schedule a visit and establish care. When you choose a [facility] primary care physician or advanced practice provider, you gain a long-term partner in health. Find a Primary Care Provider  Learn more about [facility]'s in-office and virtual care options: [facility]

## 2021-11-11 NOTE — Progress Notes (Signed)
Virtual Visit Consent   Nicole Harrell, you are scheduled for a virtual visit with a [facility] provider today.     Just as with appointments in the office, your consent must be obtained to participate.  Your consent will be active for this visit and any virtual visit you may have with one of our providers in the next 365 days.     If you have a MyChart account, a copy of this consent can be sent to you electronically.  All virtual visits are billed to your insurance company just like a traditional visit in the office.    As this is a virtual visit, video technology does not allow for your provider to perform a traditional examination.  This may limit your provider's ability to fully assess your condition.  If your provider identifies any concerns that need to be evaluated in person or the need to arrange testing (such as labs, EKG, etc.), we will make arrangements to do so.     Although advances in technology are sophisticated, we cannot ensure that it will always work on either your end or our end.  If the connection with a video visit is poor, the visit may have to be switched to a telephone visit.  With either a video or telephone visit, we are not always able to ensure that we have a secure connection.     I need to obtain your verbal consent now.   Are you willing to proceed with your visit today?    Nicole Harrell has provided verbal consent on 11/11/2021 for a virtual visit (video or telephone).   Mar Daring, PA-C   Date: 11/11/2021 10:38 AM   Virtual Visit via Video Note   I, Mar Daring, connected with  Nicole Harrell  ([id number], 06-01-1971) on 11/11/21 at 10:30 AM EST by a video-enabled telemedicine application and verified that I am speaking with the correct person using two identifiers.  Location: Patient: Virtual Visit Location Patient: Home Provider: Virtual Visit Location Provider: Home Office   I discussed the limitations of  evaluation and management by telemedicine and the availability of in person appointments. The patient expressed understanding and agreed to proceed.    History of Present Illness: Nicole Harrell is a 50 y.o. who identifies as a female who was assigned female at birth, and is being seen today for possible pink eye.  HPI: Conjunctivitis  The current episode started today. The onset was sudden. The problem occurs continuously. The problem has been unchanged. The problem is mild. Nothing relieves the symptoms. Nothing aggravates the symptoms. Associated symptoms include congestion, cough, eye discharge and eye redness. Pertinent negatives include no fever, no decreased vision, no eye itching, no photophobia and no eye pain. The eye pain is mild. The left eye is affected. The eye pain is not associated with movement. The eyelid exhibits swelling.   She was seen on 11/09/21 with flu-like symptoms. Does report from that stand point she is feeling better, but that she is still having significant sinus congestion, slight cough (worse at night) and now the eye symptoms noted above. All symptoms started Thursday, 11/06/21, with eye symptoms starting today.  Problems:  Patient Active Problem List   Diagnosis Date Noted   Glaucoma 07/31/2021   Overweight (BMI 25.0-29.9) 07/31/2021   Prediabetes 07/31/2021   Hypertension 06/23/2021   Increased risk of breast cancer 06/21/2019   Family history of breast cancer 06/21/2019   Allergic rhinitis 11/23/2016  GERD (gastroesophageal reflux disease) 11/23/2016   Reactive airway disease 11/23/2016   Reactive cervical lymphadenopathy 11/23/2016    Allergies:  Allergies  Allergen Reactions   Iodinated Diagnostic Agents Hives   Other Hives    Seasonal, cats, dogs   Penicillins Hives   Medications:  Current Outpatient Medications:    benzonatate (TESSALON) 100 MG capsule, Take 1 capsule (100 mg total) by mouth 3 (three) times daily as needed., Disp: 30  capsule, Rfl: 0   predniSONE (DELTASONE) 20 MG tablet, Take 1 tablet (20 mg total) by mouth daily with breakfast., Disp: 7 tablet, Rfl: 0   trimethoprim-polymyxin b (POLYTRIM) ophthalmic solution, Place 1 drop into the left eye every 4 (four) hours., Disp: 10 mL, Rfl: 0   albuterol (VENTOLIN HFA) 108 (90 Base) MCG/ACT inhaler, Inhale 2 puffs into the lungs every 6 (six) hours as needed for wheezing or shortness of breath., Disp: 16 g, Rfl: 0   amLODipine (NORVASC) 2.5 MG tablet, Take 1 tablet (2.5 mg total) by mouth daily., Disp: 90 tablet, Rfl: 1   cetirizine (ZYRTEC) 10 MG tablet, Take 1 tablet by mouth daily., Disp: , Rfl:    Cholecalciferol (VITAMIN D-3) 5000 units TABS, Take by mouth., Disp: , Rfl:    JAIMIESS 0.15-0.03 &0.01 MG tablet, TAKE 1 TABLET BY MOUTH  DAILY, Disp: 91 tablet, Rfl: 0   mometasone (ELOCON) 0.1 % ointment, Apply topically., Disp: , Rfl:    montelukast (SINGULAIR) 10 MG tablet, Take 10 mg by mouth at bedtime., Disp: , Rfl:    Multiple Vitamin (MULTIVITAMIN ADULT) TABS, See admin instructions., Disp: , Rfl:    nystatin (MYCOSTATIN) 100000 UNIT/ML suspension, Take 5 mLs (500,000 Units total) by mouth 4 (four) times daily., Disp: 473 mL, Rfl: 0   SIMBRINZA 1-0.2 % SUSP, Apply 1 drop to eye 2 (two) times daily., Disp: , Rfl:   Observations/Objective: Patient is well-developed, well-nourished in no acute distress.  Resting comfortably at home.  Head is normocephalic, atraumatic.  No labored breathing.  Speech is clear and coherent with logical content.  Patient is alert and oriented at baseline.    Assessment and Plan: 1. Acute bacterial conjunctivitis of left eye - trimethoprim-polymyxin b (POLYTRIM) ophthalmic solution; Place 1 drop into the left eye every 4 (four) hours.  Dispense: 10 mL; Refill: 0  2. Sinus congestion - predniSONE (DELTASONE) 20 MG tablet; Take 1 tablet (20 mg total) by mouth daily with breakfast.  Dispense: 7 tablet; Refill: 0  3. Acute  cough - benzonatate (TESSALON) 100 MG capsule; Take 1 capsule (100 mg total) by mouth 3 (three) times daily as needed.  Dispense: 30 capsule; Refill: 0  - Suspect conjunctivitis secondary to blocked sinuses - Polytrim for conjunctivitis - Prednisone for sinus congestion - Tessalon perles for cough - Push fluids - Rest - Seek in person evaluation if symptoms worsen or fail to improve.  Follow Up Instructions: I discussed the assessment and treatment plan with the patient. The patient was provided an opportunity to ask questions and all were answered. The patient agreed with the plan and demonstrated an understanding of the instructions.  A copy of instructions were sent to the patient via MyChart unless otherwise noted below.   The patient was advised to call back or seek an in-person evaluation if the symptoms worsen or if the condition fails to improve as anticipated.  Time:  I spent 12 minutes with the patient via telehealth technology discussing the above problems/concerns.    Mar Daring,  PA-C

## 2021-11-22 ENCOUNTER — Telehealth: Payer: Managed Care, Other (non HMO) | Admitting: Nurse Practitioner

## 2021-11-22 DIAGNOSIS — J029 Acute pharyngitis, unspecified: Secondary | ICD-10-CM | POA: Diagnosis not present

## 2021-11-22 MED ORDER — CEFDINIR 300 MG PO CAPS: 300.0000 mg | ORAL_CAPSULE | Freq: Two times a day (BID) | ORAL | 0 refills | Status: DC

## 2021-11-22 NOTE — Patient Instructions (Signed)
Pharyngitis ?Pharyngitis is a sore throat (pharynx). This is when there is redness, pain, and swelling in your throat. Most of the time, this condition gets better on its own. In some cases, you may need medicine. ?What are the causes? ?An infection from a virus. ?An infection from bacteria. ?Allergies. ?What increases the risk? ?Being 5-50 years old. ?Being in crowded environments. These include: ?Daycares. ?Schools. ?Dormitories. ?Living in a place with cold temperatures outside. ?Having a weakened disease-fighting (immune) system. ?What are the signs or symptoms? ?Symptoms may vary depending on the cause. Common symptoms include: ?Sore throat. ?Tiredness (fatigue). ?Low-grade fever. ?Stuffy nose. ?Cough. ?Headache. ?Other symptoms may include: ?Glands in the neck (lymph nodes) that are swollen. ?Skin rashes. ?Film on the throat or tonsils. This can be caused by an infection from bacteria. ?Vomiting. ?Red, itchy eyes. ?Loss of appetite. ?Joint pain and muscle aches. ?Tonsils that are temporarily bigger than usual (enlarged). ?How is this treated? ?Many times, treatment is not needed. This condition usually gets better in 3-4 days without treatment. ?If the infection is caused by a bacteria, you may be need to take antibiotics. ?Follow these instructions at home: ?Medicines ?Take over-the-counter and prescription medicines only as told by your doctor. ?If you were prescribed an antibiotic medicine, take it as told by your doctor. Do not stop taking the antibiotic even if you start to feel better. ?Use throat lozenges or sprays to soothe your throat as told by your doctor. ?Children can get pharyngitis. Do not give your child aspirin. ?Managing pain ?To help with pain, try: ?Sipping warm liquids, such as: ?Broth. ?Herbal tea. ?Warm water. ?Eating or drinking cold or frozen liquids, such as frozen ice pops. ?Rinsing your mouth (gargle) with a salt water mixture 3-4 times a day or as needed. ?To make salt water,  dissolve ?-1 tsp (3-6 g) of salt in 1 cup (237 mL) of warm water. ?Do not swallow this mixture. ?Sucking on hard candy or throat lozenges. ?Putting a cool-mist humidifier in your bedroom at night to moisten the air. ?Sitting in the bathroom with the door closed for 5-10 minutes while you run hot water in the shower. ? ?General instructions ? ?Do not smoke or use any products that contain nicotine or tobacco. If you need help quitting, ask your doctor. ?Rest as told by your doctor. ?Drink enough fluid to keep your pee (urine) pale yellow. ?How is this prevented? ?Wash your hands often for at least 20 seconds with soap and water. If soap and water are not available, use hand sanitizer. ?Do not touch your eyes, nose, or mouth with unwashed hands. Wash hands after touching these areas. ?Do not share cups or eating utensils. ?Avoid close contact with people who are sick. ?Contact a doctor if: ?You have large, tender lumps in your neck. ?You have a rash. ?You cough up green, yellow-brown, or bloody spit. ?Get help right away if: ?You have a stiff neck. ?You drool or cannot swallow liquids. ?You cannot drink or take medicines without vomiting. ?You have very bad pain that does not go away with medicine. ?You have problems breathing, and it is not from a stuffy nose. ?You have new pain and swelling in your knees, ankles, wrists, or elbows. ?These symptoms may be an emergency. Get help right away. Call your local emergency services (911 in the [place] ?Do not wait to see if the symptoms will go away. ?Do not drive yourself to the hospital. ?Summary ?Pharyngitis is a sore throat (pharynx). This is   when there is redness, pain, and swelling in your throat. ?Most of the time, pharyngitis gets better on its own. Sometimes, you may need medicine. ?If you were prescribed an antibiotic medicine, take it as told by your doctor. Do not stop taking the antibiotic even if you start to feel better. ?This information is not intended to  replace advice given to you by your health care provider. Make sure you discuss any questions you have with your health care provider. ?Document Revised: 03/05/2021 Document Reviewed: 03/05/2021 ?Elsevier Patient Education ? 2022 [employer] ? ?

## 2021-11-22 NOTE — Progress Notes (Signed)
Virtual Visit Consent   Nicole Harrell, you are scheduled for a virtual visit with Mary-Margaret Hassell Done, [place], a [facility] provider, today.     Just as with appointments in the office, your consent must be obtained to participate.  Your consent will be active for this visit and any virtual visit you may have with one of our providers in the next 365 days.     If you have a MyChart account, a copy of this consent can be sent to you electronically.  All virtual visits are billed to your insurance company just like a traditional visit in the office.    As this is a virtual visit, video technology does not allow for your provider to perform a traditional examination.  This may limit your provider's ability to fully assess your condition.  If your provider identifies any concerns that need to be evaluated in person or the need to arrange testing (such as labs, EKG, etc.), we will make arrangements to do so.     Although advances in technology are sophisticated, we cannot ensure that it will always work on either your end or our end.  If the connection with a video visit is poor, the visit may have to be switched to a telephone visit.  With either a video or telephone visit, we are not always able to ensure that we have a secure connection.     I need to obtain your verbal consent now.   Are you willing to proceed with your visit today? YES   Nicole Harrell has provided verbal consent on 11/22/2021 for a virtual visit (video or telephone).   Mary-Margaret Hassell Done, FNP   Date: 11/22/2021 7:38 AM   Virtual Visit via Video Note   I, Mary-Margaret Hassell Done, connected with Nicole Harrell ([id number], [birth year]) on 11/22/21 at  7:45 AM EST by a video-enabled telemedicine application and verified that I am speaking with the correct person using two identifiers.  Location: Patient: Virtual Visit Location Patient: Home Provider: Virtual Visit Location Provider: Mobile   I discussed  the limitations of evaluation and management by telemedicine and the availability of in person appointments. The patient expressed understanding and agreed to proceed.    History of Present Illness: Nicole Harrell is a 50 y.o. who identifies as a female who was assigned female at birth, and is being seen today for sore throat.  HPI: Patient states she was diagnosed with the flu just prior to thanksgiving. She was not treated with tamiflu. 2 weeks later she got prednisone for sinus pressure and she just finished that on this past Sunday. She has gotten better but she still has extreme sore throat, which is making it difficult for her to swallow. Has ear acheon right. This has been going on since Monday.   Review of Systems  Constitutional:  Positive for malaise/fatigue. Negative for chills and fever.  HENT:  Positive for congestion and sore throat.   Respiratory:  Negative for cough and sputum production.   Musculoskeletal:  Negative for myalgias.  Neurological:  Negative for dizziness and headaches.   Problems:  Patient Active Problem List   Diagnosis Date Noted   Glaucoma 07/31/2021   Overweight (BMI 25.0-29.9) 07/31/2021   Prediabetes 07/31/2021   Hypertension 06/23/2021   Increased risk of breast cancer 06/21/2019   Family history of breast cancer 06/21/2019   Allergic rhinitis 11/23/2016   GERD (gastroesophageal reflux disease) 11/23/2016   Reactive airway disease 11/23/2016   Reactive  cervical lymphadenopathy 11/23/2016    Allergies:  Allergies  Allergen Reactions   Iodinated Diagnostic Agents Hives   Other Hives    Seasonal, cats, dogs   Penicillins Hives   Medications:  Current Outpatient Medications:    albuterol (VENTOLIN HFA) 108 (90 Base) MCG/ACT inhaler, Inhale 2 puffs into the lungs every 6 (six) hours as needed for wheezing or shortness of breath., Disp: 16 g, Rfl: 0   amLODipine (NORVASC) 2.5 MG tablet, Take 1 tablet (2.5 mg total) by mouth daily., Disp: 90  tablet, Rfl: 1   benzonatate (TESSALON) 100 MG capsule, Take 1 capsule (100 mg total) by mouth 3 (three) times daily as needed., Disp: 30 capsule, Rfl: 0   cetirizine (ZYRTEC) 10 MG tablet, Take 1 tablet by mouth daily., Disp: , Rfl:    Cholecalciferol (VITAMIN D-3) 5000 units TABS, Take by mouth., Disp: , Rfl:    JAIMIESS 0.15-0.03 &0.01 MG tablet, TAKE 1 TABLET BY MOUTH  DAILY, Disp: 91 tablet, Rfl: 0   mometasone (ELOCON) 0.1 % ointment, Apply topically., Disp: , Rfl:    montelukast (SINGULAIR) 10 MG tablet, Take 10 mg by mouth at bedtime., Disp: , Rfl:    Multiple Vitamin (MULTIVITAMIN ADULT) TABS, See admin instructions., Disp: , Rfl:    nystatin (MYCOSTATIN) 100000 UNIT/ML suspension, Take 5 mLs (500,000 Units total) by mouth 4 (four) times daily., Disp: 473 mL, Rfl: 0   predniSONE (DELTASONE) 20 MG tablet, Take 1 tablet (20 mg total) by mouth daily with breakfast., Disp: 7 tablet, Rfl: 0   SIMBRINZA 1-0.2 % SUSP, Apply 1 drop to eye 2 (two) times daily., Disp: , Rfl:    trimethoprim-polymyxin b (POLYTRIM) ophthalmic solution, Place 1 drop into the left eye every 4 (four) hours., Disp: 10 mL, Rfl: 0  Observations/Objective: Patient is well-developed, well-nourished in no acute distress.  Resting comfortably  at home.  Head is normocephalic, atraumatic.  No labored breathing.  Speech is clear and coherent with logical content.  Patient is alert and oriented at baseline.  Difficult talking due  to throat. Pain. Looks like she is struggling to swallow.  Assessment and Plan:  Nicole Harrell in today with chief complaint of No chief complaint on file.   1. Pharyngitis, unspecified etiology Force fluids Motrin or tylenol OTC OTC decongestant Throat lozenges if help New toothbrush in 3 days  Meds ordered this encounter  Medications   cefdinir (OMNICEF) 300 MG capsule    Sig: Take 1 capsule (300 mg total) by mouth 2 (two) times daily. 1 po BID    Dispense:  20 capsule     Refill:  0    Order Specific Question:   Supervising Provider    Answer:   Noemi Chapel [[id number]]       Follow Up Instructions: I discussed the assessment and treatment plan with the patient. The patient was provided an opportunity to ask questions and all were answered. The patient agreed with the plan and demonstrated an understanding of the instructions.  A copy of instructions were sent to the patient via MyChart.  The patient was advised to call back or seek an in-person evaluation if the symptoms worsen or if the condition fails to improve as anticipated.  Time:  I spent 11 minutes with the patient via telehealth technology discussing the above problems/concerns.    Mary-Margaret Hassell Done, FNP

## 2021-12-04 ENCOUNTER — Other Ambulatory Visit: Payer: Self-pay | Admitting: Ophthalmology

## 2021-12-04 DIAGNOSIS — H4720 Unspecified optic atrophy: Secondary | ICD-10-CM

## 2021-12-09 ENCOUNTER — Other Ambulatory Visit: Payer: Self-pay

## 2021-12-09 ENCOUNTER — Encounter: Payer: Self-pay | Admitting: Obstetrics and Gynecology

## 2021-12-09 ENCOUNTER — Ambulatory Visit (INDEPENDENT_AMBULATORY_CARE_PROVIDER_SITE_OTHER): Payer: Managed Care, Other (non HMO) | Admitting: Obstetrics and Gynecology

## 2021-12-09 VITALS — BP 130/70 | Ht 66.0 in | Wt 183.0 lb

## 2021-12-09 DIAGNOSIS — Z803 Family history of malignant neoplasm of breast: Secondary | ICD-10-CM | POA: Diagnosis not present

## 2021-12-09 DIAGNOSIS — Z1211 Encounter for screening for malignant neoplasm of colon: Secondary | ICD-10-CM

## 2021-12-09 DIAGNOSIS — Z3041 Encounter for surveillance of contraceptive pills: Secondary | ICD-10-CM | POA: Diagnosis not present

## 2021-12-09 DIAGNOSIS — Z9189 Other specified personal risk factors, not elsewhere classified: Secondary | ICD-10-CM

## 2021-12-09 DIAGNOSIS — Z01419 Encounter for gynecological examination (general) (routine) without abnormal findings: Secondary | ICD-10-CM | POA: Diagnosis not present

## 2021-12-09 DIAGNOSIS — Z1231 Encounter for screening mammogram for malignant neoplasm of breast: Secondary | ICD-10-CM | POA: Diagnosis not present

## 2021-12-09 MED ORDER — NORETHINDRONE 0.35 MG PO TABS: 1.0000 | ORAL_TABLET | Freq: Every day | ORAL | 3 refills | Status: DC

## 2021-12-09 NOTE — Progress Notes (Signed)
PCP:  Elby Beck, FNP   Chief Complaint  Patient presents with   Gynecologic Exam    No concerns     HPI:      Nicole Harrell is a 50 y.o. [phone] whose LMP was Patient's last menstrual period was 10/06/2021 (approximate)., presents today for her annual examination.  Her menses are Q3 months with OCPs, lasting 3-4 days, rarely with BTB. Dysmenorrhea mild.   Sex activity: sexually active--contraception OCPs. Diagnosed with HTN this yr and glaucoma, doing tx.  Last Pap: 10/28/20 Results were: no abnormalities /neg HPV DNA  Hx of STDs: none  Last mammogram: 11/12/20 Results were: normal--routine follow-up in 12 months.  There is a FH of breast cancer in her mom and pat aunt. There is a FH of ovarian cancer in her pat aunt, and colon cancer in her pat uncle. The patient does do self-breast exams. BRCA neg 6/12; neg MyRisk update testing 2/20. Riskscore=34.5%/IBIS=21%. Taking Vit D supp. Pt aware of scr breast MRI option in conjunction with yearly mammo, but not done.  Tobacco use: The patient denies current or previous tobacco use. Alcohol use: none No drug use.  Exercise: not active  Colonoscopy: never; didn't do cologuard last yr; would like GI ref  She does get adequate calcium and Vitamin D in her diet.  Labs with PCP/work screening.   Past Medical History:  Diagnosis Date   Asthma    BRCA negative 2012, 2/19   BRCA neg 2012; MyRIsk neg 2019   Depression    Family history of breast cancer    Family history of ovarian cancer    6/12 BRCA Neg   Glaucoma    Hypertension    Increased risk of breast cancer 01/2018   IBIS=21%/riskscore=34.5%   Plantar fasciitis     Past Surgical History:  Procedure Laterality Date   LYMPH NODE BIOPSY      Family History  Problem Relation Age of Onset   Breast cancer Mother 105   Asthma Mother    Cancer Mother    Diabetes Mother    Heart attack Mother    Breast cancer Paternal Aunt 49   Colon cancer Paternal  Uncle 75   Ovarian cancer Paternal Aunt 22   Cancer Father    Hyperlipidemia Father    Hypertension Father    Asthma Brother    Cancer Brother    Depression Brother    Hypertension Brother    Learning disabilities Brother    Miscarriages / [place] Brother    Mental illness Brother    Alzheimer's disease Maternal Grandmother    Cancer Paternal Grandmother        stomach    Social History   Socioeconomic History   Marital status: Married    Spouse name: Not on file   Number of children: Not on file   Years of education: Not on file   Highest education level: Not on file  Occupational History   Not on file  Tobacco Use   Smoking status: Never   Smokeless tobacco: Never  Vaping Use   Vaping Use: Never used  Substance and Sexual Activity   Alcohol use: No   Drug use: No   Sexual activity: Yes    Birth control/protection: Pill  Other Topics Concern   Not on file  Social History Narrative   Works at [employer], Conservation officer, historic buildings.     Social Determinants of Health   Financial Resource Strain: Not on file  Food Insecurity: Not on file  Transportation Needs: Not on file  Physical Activity: Not on file  Stress: Not on file  Social Connections: Not on file  Intimate Partner Violence: Not on file    Current Meds  Medication Sig   ADVAIR DISKUS 250-50 MCG/ACT AEPB Inhale 1 puff into the lungs 2 (two) times daily.   albuterol (VENTOLIN HFA) 108 (90 Base) MCG/ACT inhaler Inhale 2 puffs into the lungs every 6 (six) hours as needed for wheezing or shortness of breath.   amLODipine (NORVASC) 2.5 MG tablet Take 1 tablet (2.5 mg total) by mouth daily.   cetirizine (ZYRTEC) 10 MG tablet Take 1 tablet by mouth daily.   Cholecalciferol (VITAMIN D-3) 5000 units TABS Take by mouth.   montelukast (SINGULAIR) 10 MG tablet Take 10 mg by mouth at bedtime.   Multiple Vitamin (MULTIVITAMIN ADULT) TABS See admin instructions.   SIMBRINZA 1-0.2 % SUSP Apply 1 drop to eye 2 (two) times  daily.   [DISCONTINUED] JAIMIESS 0.15-0.03 &0.01 MG tablet TAKE 1 TABLET BY MOUTH  DAILY   [DISCONTINUED] norethindrone (MICRONOR) 0.35 MG tablet Take 1 tablet (0.35 mg total) by mouth daily.     ROS:  Review of Systems  Constitutional:  Negative for fatigue, fever and unexpected weight change.  Respiratory:  Negative for cough, shortness of breath and wheezing.   Cardiovascular:  Negative for chest pain, palpitations and leg swelling.  Gastrointestinal:  Negative for blood in stool, constipation, diarrhea, nausea and vomiting.  Endocrine: Negative for cold intolerance, heat intolerance and polyuria.  Genitourinary:  Negative for dyspareunia, dysuria, flank pain, frequency, genital sores, hematuria, menstrual problem, pelvic pain, urgency, vaginal bleeding, vaginal discharge and vaginal pain.  Musculoskeletal:  Negative for back pain, joint swelling and myalgias.  Skin:  Negative for rash.  Neurological:  Negative for dizziness, syncope, light-headedness, numbness and headaches.  Hematological:  Negative for adenopathy.  Psychiatric/Behavioral:  Negative for agitation, confusion, sleep disturbance and suicidal ideas. The patient is not nervous/anxious.     Objective: BP 130/70    Ht _0  (1.676 m)    Wt 183 lb (83 kg)    LMP 10/06/2021 (Approximate)    BMI 29.54 kg/m    Physical Exam Constitutional:      Appearance: She is well-developed.  Genitourinary:     Vulva normal.     Right Labia: No rash, tenderness or lesions.    Left Labia: No tenderness, lesions or rash.    No vaginal discharge, erythema or tenderness.      Right Adnexa: not tender and no mass present.    Left Adnexa: not tender and no mass present.    No cervical motion tenderness, friability or polyp.     Uterus is not enlarged or tender.  Breasts:    Right: No mass, nipple discharge, skin change or tenderness.     Left: No mass, nipple discharge, skin change or tenderness.  Neck:     Thyroid: No  thyromegaly.  Cardiovascular:     Rate and Rhythm: Normal rate and regular rhythm.     Heart sounds: Normal heart sounds. No murmur heard. Pulmonary:     Effort: Pulmonary effort is normal.     Breath sounds: Normal breath sounds.  Abdominal:     Palpations: Abdomen is soft.     Tenderness: There is no abdominal tenderness. There is no guarding or rebound.  Musculoskeletal:        General: Normal range of motion.  Cervical back: Normal range of motion.  Lymphadenopathy:     Cervical: No cervical adenopathy.  Neurological:     General: No focal deficit present.     Mental Status: She is alert and oriented to person, place, and time.     Cranial Nerves: No cranial nerve deficit.  Skin:    General: Skin is warm and dry.  Psychiatric:        Mood and Affect: Mood normal.        Behavior: Behavior normal.        Thought Content: Thought content normal.        Judgment: Judgment normal.  Vitals reviewed.    Assessment/Plan: Encounter for annual routine gynecological examination  Encounter for surveillance of contraceptive pills - Plan: norethindrone (MICRONOR) 0.35 MG tablet, change to POPs due to HTN dx. Rx eRxd to [facility]. F/u prn.   Encounter for screening mammogram for malignant neoplasm of breast - Plan: MM 3D SCREEN BREAST BILATERAL; pt to [facility]  Family history of breast cancer--Pt is MyRisk neg  Increased risk of breast cancer--pt aware of monthly SBE, yearly CBE and mammos, as well as scr breast MRI. Will call for MRI ref spring 2023 if desires. Cont Vit D supp.   Screening for colon cancer - Plan: Ambulatory referral to Gastroenterology   Meds ordered this encounter  Medications                            norethindrone (MICRONOR) 0.35 MG tablet    Sig: Take 1 tablet (0.35 mg total) by mouth daily.    Dispense:  84 tablet    Refill:  3    Order Specific Question:   Supervising Provider    Answer:   Gae Dry [[id number]]             GYN counsel  breast self exam, mammography screening, menopause, adequate intake of calcium and vitamin D, diet and exercise     F/U  Return in about 1 year (around 12/09/2022).  Tulip Meharg B. Azir Muzyka, PA-C 12/09/2021 3:45 PM

## 2021-12-09 NOTE — Patient Instructions (Signed)
I value your feedback and you entrusting us with your care. If you get a [facility] patient survey, I would appreciate you taking the time to let us know about your experience today. Thank you!  [facility]: [phone]      

## 2021-12-18 ENCOUNTER — Other Ambulatory Visit: Payer: Self-pay

## 2021-12-18 DIAGNOSIS — Z1211 Encounter for screening for malignant neoplasm of colon: Secondary | ICD-10-CM

## 2021-12-18 MED ORDER — CLENPIQ 10-3.5-12 MG-GM -GM/160ML PO SOLN: 1.0000 | Freq: Once | ORAL | 0 refills | Status: AC

## 2021-12-18 NOTE — Progress Notes (Signed)
Gastroenterology Pre-Procedure Review  Request Date: 01/02/2022 Requesting Physician: Dr. Allen Norris  PATIENT REVIEW QUESTIONS: The patient responded to the following health history questions as indicated:    1. Are you having any GI issues? no 2. Do you have a personal history of Polyps? no 3. Do you have a family history of Colon Cancer or Polyps? yes (Paternal Uncle) 4. Diabetes Mellitus? no 5. Joint replacements in the past 12 months?no 6. Major health problems in the past 3 months?no 7. Any artificial heart valves, MVP, or defibrillator?no    MEDICATIONS & ALLERGIES:    Patient reports the following regarding taking any anticoagulation/antiplatelet therapy:   Plavix, Coumadin, Eliquis, Xarelto, Lovenox, Pradaxa, Brilinta, or Effient? no Aspirin? no  Patient confirms/reports the following medications:  Current Outpatient Medications  Medication Sig Dispense Refill   ADVAIR DISKUS 250-50 MCG/ACT AEPB Inhale 1 puff into the lungs 2 (two) times daily.     albuterol (VENTOLIN HFA) 108 (90 Base) MCG/ACT inhaler Inhale 2 puffs into the lungs every 6 (six) hours as needed for wheezing or shortness of breath. 16 g 0   amLODipine (NORVASC) 2.5 MG tablet Take 1 tablet (2.5 mg total) by mouth daily. 90 tablet 1   cetirizine (ZYRTEC) 10 MG tablet Take 1 tablet by mouth daily.     Cholecalciferol (VITAMIN D-3) 5000 units TABS Take by mouth.     montelukast (SINGULAIR) 10 MG tablet Take 10 mg by mouth at bedtime.     Multiple Vitamin (MULTIVITAMIN ADULT) TABS See admin instructions.     norethindrone (MICRONOR) 0.35 MG tablet Take 1 tablet (0.35 mg total) by mouth daily. 84 tablet 3   SIMBRINZA 1-0.2 % SUSP Apply 1 drop to eye 2 (two) times daily.     No current facility-administered medications for this visit.    Patient confirms/reports the following allergies:  Allergies  Allergen Reactions   Iodinated Contrast Media Hives   Other Hives    Seasonal, cats, dogs   Penicillins Hives     No orders of the defined types were placed in this encounter.   AUTHORIZATION INFORMATION Primary Insurance: 1D#: Group #:  Secondary Insurance: 1D#: Group #:  SCHEDULE INFORMATION: Date: 01/02/2022 Time: Location: [place]

## 2021-12-19 ENCOUNTER — Other Ambulatory Visit: Payer: Self-pay

## 2021-12-19 ENCOUNTER — Encounter: Payer: Self-pay | Admitting: Gastroenterology

## 2021-12-19 ENCOUNTER — Ambulatory Visit
Admission: RE | Admit: 2021-12-19 | Discharge: 2021-12-19 | Disposition: A | Discharge status: Home / Self Care | Payer: Managed Care, Other (non HMO) | Source: Ambulatory Visit | Attending: Ophthalmology | Admitting: Ophthalmology

## 2021-12-19 DIAGNOSIS — H4720 Unspecified optic atrophy: Secondary | ICD-10-CM | POA: Insufficient documentation

## 2021-12-19 MED ORDER — GADOBUTROL 1 MMOL/ML IV SOLN
7.5000 mL | Freq: Once | INTRAVENOUS | Status: AC | PRN
  Administered 2021-12-19: 19:00:00 7.5 mL via INTRAVENOUS

## 2021-12-31 ENCOUNTER — Ambulatory Visit
Admission: RE | Admit: 2021-12-31 | Discharge: 2021-12-31 | Disposition: A | Discharge status: Home / Self Care | Payer: Managed Care, Other (non HMO) | Source: Ambulatory Visit | Attending: Obstetrics and Gynecology | Admitting: Obstetrics and Gynecology

## 2021-12-31 ENCOUNTER — Other Ambulatory Visit: Payer: Self-pay

## 2021-12-31 DIAGNOSIS — Z803 Family history of malignant neoplasm of breast: Secondary | ICD-10-CM | POA: Diagnosis present

## 2021-12-31 DIAGNOSIS — Z1231 Encounter for screening mammogram for malignant neoplasm of breast: Secondary | ICD-10-CM | POA: Insufficient documentation

## 2021-12-31 DIAGNOSIS — Z9189 Other specified personal risk factors, not elsewhere classified: Secondary | ICD-10-CM | POA: Diagnosis present

## 2022-01-01 ENCOUNTER — Other Ambulatory Visit: Payer: Self-pay | Admitting: Obstetrics and Gynecology

## 2022-01-01 DIAGNOSIS — R928 Other abnormal and inconclusive findings on diagnostic imaging of breast: Secondary | ICD-10-CM

## 2022-01-01 DIAGNOSIS — N6310 Unspecified lump in the right breast, unspecified quadrant: Secondary | ICD-10-CM

## 2022-01-02 ENCOUNTER — Ambulatory Visit
Admission: RE | Admit: 2022-01-02 | Discharge: 2022-01-02 | Disposition: A | Discharge status: Home / Self Care | Payer: Managed Care, Other (non HMO) | Attending: Gastroenterology | Admitting: Gastroenterology

## 2022-01-02 ENCOUNTER — Ambulatory Visit: Payer: Managed Care, Other (non HMO) | Admitting: Anesthesiology

## 2022-01-02 ENCOUNTER — Other Ambulatory Visit: Payer: Self-pay

## 2022-01-02 ENCOUNTER — Encounter: Payer: Self-pay | Admitting: Gastroenterology

## 2022-01-02 ENCOUNTER — Encounter
Admission: RE | Disposition: A | Discharge status: Home / Self Care | Payer: Self-pay | Source: Home / Self Care | Attending: Gastroenterology

## 2022-01-02 DIAGNOSIS — I10 Essential (primary) hypertension: Secondary | ICD-10-CM | POA: Diagnosis not present

## 2022-01-02 DIAGNOSIS — Z1211 Encounter for screening for malignant neoplasm of colon: Secondary | ICD-10-CM

## 2022-01-02 DIAGNOSIS — K219 Gastro-esophageal reflux disease without esophagitis: Secondary | ICD-10-CM | POA: Diagnosis not present

## 2022-01-02 DIAGNOSIS — D125 Benign neoplasm of sigmoid colon: Secondary | ICD-10-CM | POA: Diagnosis not present

## 2022-01-02 DIAGNOSIS — K635 Polyp of colon: Secondary | ICD-10-CM | POA: Diagnosis not present

## 2022-01-02 DIAGNOSIS — J45909 Unspecified asthma, uncomplicated: Secondary | ICD-10-CM | POA: Diagnosis not present

## 2022-01-02 HISTORY — PX: POLYPECTOMY: SHX5525

## 2022-01-02 HISTORY — PX: COLONOSCOPY WITH PROPOFOL: SHX5780

## 2022-01-02 LAB — POCT PREGNANCY, URINE: Preg Test, Ur: NEGATIVE

## 2022-01-02 SURGERY — COLONOSCOPY WITH PROPOFOL
Anesthesia: General | Site: Rectum

## 2022-01-02 MED ORDER — PROPOFOL 10 MG/ML IV BOLUS
INTRAVENOUS | Status: DC | PRN
  Administered 2022-01-02: 50 mg via INTRAVENOUS
  Administered 2022-01-02 (×2): 70 mg via INTRAVENOUS

## 2022-01-02 MED ORDER — STERILE WATER FOR IRRIGATION IR SOLN
Status: DC | PRN
  Administered 2022-01-02: 1

## 2022-01-02 MED ORDER — SODIUM CHLORIDE 0.9 % IV SOLN: INTRAVENOUS | Status: DC

## 2022-01-02 MED ORDER — LACTATED RINGERS IV SOLN: INTRAVENOUS | Status: DC

## 2022-01-02 MED ORDER — LIDOCAINE HCL (CARDIAC) PF 100 MG/5ML IV SOSY
PREFILLED_SYRINGE | INTRAVENOUS | Status: DC | PRN
  Administered 2022-01-02: 40 mg via INTRAVENOUS

## 2022-01-02 MED ORDER — LACTATED RINGERS IV SOLN: INTRAVENOUS | Status: DC | PRN

## 2022-01-02 SURGICAL SUPPLY — 22 items
CLIP HMST 235XBRD CATH ROT (MISCELLANEOUS) IMPLANT
CLIP RESOLUTION 360 11X235 (MISCELLANEOUS)
ELECT REM PT RETURN 9FT ADLT (ELECTROSURGICAL) ×2
ELECTRODE REM PT RTRN 9FT ADLT (ELECTROSURGICAL) IMPLANT
FORCEPS BIOP RAD 4 LRG CAP 4 (CUTTING FORCEPS) IMPLANT
GOWN CVR UNV OPN BCK APRN NK (MISCELLANEOUS) ×2 IMPLANT
GOWN ISOL THUMB LOOP REG UNIV (MISCELLANEOUS) ×4
INJECTOR VARIJECT VIN23 (MISCELLANEOUS) IMPLANT
KIT DEFENDO VALVE AND CONN (KITS) IMPLANT
KIT PRC NS LF DISP ENDO (KITS) ×1 IMPLANT
KIT PROCEDURE OLYMPUS (KITS) ×2
MANIFOLD NEPTUNE II (INSTRUMENTS) ×2 IMPLANT
MARKER SPOT ENDO TATTOO 5ML (MISCELLANEOUS) IMPLANT
PROBE APC STR FIRE (PROBE) IMPLANT
RETRIEVER NET ROTH 2.5X230 LF (MISCELLANEOUS) IMPLANT
SNARE COLD EXACTO (MISCELLANEOUS) IMPLANT
SNARE SHORT THROW 13M SML OVAL (MISCELLANEOUS) ×1 IMPLANT
SNARE SNG USE RND 15MM (INSTRUMENTS) IMPLANT
SPOT EX ENDOSCOPIC TATTOO (MISCELLANEOUS)
TRAP ETRAP POLY (MISCELLANEOUS) ×1 IMPLANT
VARIJECT INJECTOR VIN23 (MISCELLANEOUS)
WATER STERILE IRR 250ML POUR (IV SOLUTION) ×2 IMPLANT

## 2022-01-02 NOTE — Transfer of Care (Signed)
Immediate Anesthesia Transfer of Care Note  Patient: Nicole Harrell  Procedure(s) Performed: COLONOSCOPY WITH PROPOFOL (Rectum) POLYPECTOMY (Rectum)  Patient Location: PACU  Anesthesia Type: General  Level of Consciousness: awake, alert  and patient cooperative  Airway and Oxygen Therapy: Patient Spontanous Breathing and Patient connected to supplemental oxygen  Post-op Assessment: Post-op Vital signs reviewed, Patient's Cardiovascular Status Stable, Respiratory Function Stable, Patent Airway and No signs of Nausea or vomiting  Post-op Vital Signs: Reviewed and stable  Complications: No notable events documented.

## 2022-01-02 NOTE — Anesthesia Postprocedure Evaluation (Signed)
Anesthesia Post Note  Patient: Nicole Harrell  Procedure(s) Performed: COLONOSCOPY WITH PROPOFOL (Rectum) POLYPECTOMY (Rectum)     Patient location during evaluation: PACU Anesthesia Type: General Level of consciousness: awake and alert Pain management: pain level controlled Vital Signs Assessment: post-procedure vital signs reviewed and stable Respiratory status: spontaneous breathing, nonlabored ventilation and respiratory function stable Cardiovascular status: blood pressure returned to baseline and stable Postop Assessment: no apparent nausea or vomiting Anesthetic complications: no   No notable events documented.  Wanda Plump Ariyel Jeangilles

## 2022-01-02 NOTE — H&P (Signed)
[name], MD [facility] [address], [place], [place] [address] Phone: [phone] Fax : [phone]  Primary Care Physician:  Elby Beck, FNP Primary Gastroenterologist:  Dr. Allen Norris  Pre-Procedure History & Physical: HPI:  [name] is a 51 y.o. female is here for a screening colonoscopy.   Past Medical History:  Diagnosis Date   Asthma    BRCA negative 2012, 2/19   BRCA neg 2012; MyRIsk neg 2019   Depression    Family history of breast cancer    Family history of ovarian cancer    6/12 BRCA Neg   Glaucoma    Hypertension    Increased risk of breast cancer 01/2018   IBIS=21%/riskscore=34.5%   Plantar fasciitis     Past Surgical History:  Procedure Laterality Date   LYMPH NODE BIOPSY      Prior to Admission medications   Medication Sig Start Date End Date Taking? Authorizing Provider  ADVAIR DISKUS 250-50 MCG/ACT AEPB Inhale 1 puff into the lungs 2 (two) times daily. 10/22/21  Yes [provider]  albuterol (VENTOLIN HFA) 108 (90 Base) MCG/ACT inhaler Inhale 2 puffs into the lungs every 6 (six) hours as needed for wheezing or shortness of breath. 02/15/20  Yes Montine Circle, PA-C  amLODipine (NORVASC) 2.5 MG tablet Take 1 tablet (2.5 mg total) by mouth daily. 09/01/21  Yes Dutch Quint B, FNP  cetirizine (ZYRTEC) 10 MG tablet Take 1 tablet by mouth daily.   Yes [provider]  Cholecalciferol (VITAMIN D-3) 5000 units TABS Take by mouth.   Yes [provider]  montelukast (SINGULAIR) 10 MG tablet Take 10 mg by mouth at bedtime. 10/06/16  Yes [provider]  Multiple Vitamin (MULTIVITAMIN ADULT) TABS See admin instructions.   Yes [provider]  norethindrone (MICRONOR) 0.35 MG tablet Take 1 tablet (0.35 mg total) by mouth daily. 70/35/00  Yes Copland, Deirdre Evener, PA-C  SIMBRINZA 1-0.2 % SUSP Apply 1 drop to eye 2 (two) times daily. 07/02/21   [provider]    Allergies as of 12/18/2021 -  Review Complete 12/09/2021  Allergen Reaction Noted   Iodinated contrast media Hives 09/17/2014   Other Hives 09/17/2014   Penicillins Hives 09/17/2014    Family History  Problem Relation Age of Onset   Breast cancer Mother 71   Asthma Mother    Cancer Mother    Diabetes Mother    Heart attack Mother    Breast cancer Paternal Aunt 49   Colon cancer Paternal Uncle 43   Ovarian cancer Paternal Aunt 34   Cancer Father    Hyperlipidemia Father    Hypertension Father    Asthma Brother    Cancer Brother    Depression Brother    Hypertension Brother    Learning disabilities Brother    Miscarriages / [place] Brother    Mental illness Brother    Alzheimer's disease Maternal Grandmother    Cancer Paternal Grandmother        stomach    Social History   Socioeconomic History   Marital status: Married    Spouse name: Not on file   Number of children: Not on file   Years of education: Not on file   Highest education level: Not on file  Occupational History   Not on file  Tobacco Use   Smoking status: Never   Smokeless tobacco: Never  Vaping Use   Vaping Use: Never used  Substance and Sexual Activity   Alcohol  use: No   Drug use: No   Sexual activity: Yes    Birth control/protection: Pill  Other Topics Concern   Not on file  Social History Narrative   Works at [employer], Conservation officer, historic buildings.     Social Determinants of Health   Financial Resource Strain: Not on file  Food Insecurity: Not on file  Transportation Needs: Not on file  Physical Activity: Not on file  Stress: Not on file  Social Connections: Not on file  Intimate Partner Violence: Not on file    Review of Systems: See HPI, otherwise negative ROS  Physical Exam: BP 136/87    Pulse 87    Temp 99.1 F (37.3 C) (Temporal)    Ht '5\' 6"'  (1.676 m)    Wt 82.1 kg    SpO2 100%    BMI 29.21 kg/m  General:   Alert,  pleasant and cooperative in NAD Head:  Normocephalic and atraumatic. Neck:  Supple; no  masses or thyromegaly. Lungs:  Clear throughout to auscultation.    Heart:  Regular rate and rhythm. Abdomen:  Soft, nontender and nondistended. Normal bowel sounds, without guarding, and without rebound.   Neurologic:  Alert and  oriented x4;  grossly normal neurologically.  Impression/Plan: [name] is now here to undergo a screening colonoscopy.  Risks, benefits, and alternatives regarding colonoscopy have been reviewed with the patient.  Questions have been answered.  All parties agreeable.

## 2022-01-02 NOTE — Op Note (Signed)
[facility] Gastroenterology Patient Name: Nicole Harrell Procedure Date: 01/02/2022 7:58 AM MRN: [id number] Account #: [id number] Date of Birth: 04/24/1971 Admit Type: Outpatient Age: 51 Room: [facility] OR ROOM 01 Gender: Female Note Status: Finalized Instrument Name: [id number] Procedure:             Colonoscopy Indications:           Screening for colorectal malignant neoplasm Providers:             [name] MD, MD Referring MD:          Elby Beck (Referring MD) Medicines:             Propofol per Anesthesia Complications:         No immediate complications. Procedure:             Pre-Anesthesia Assessment:                        - Prior to the procedure, a History and Physical was                         performed, and patient medications and allergies were                         reviewed. The patient's tolerance of previous                         anesthesia was also reviewed. The risks and benefits                         of the procedure and the sedation options and risks                         were discussed with the patient. All questions were                         answered, and informed consent was obtained. Prior                         Anticoagulants: The patient has taken no previous                         anticoagulant or antiplatelet agents. ASA Grade                         Assessment: II - A patient with mild systemic disease.                         After reviewing the risks and benefits, the patient                         was deemed in satisfactory condition to undergo the                         procedure.                        After obtaining informed consent, the colonoscope was  passed under direct vision. Throughout the procedure,                         the patient's blood pressure, pulse, and oxygen                         saturations were monitored continuously. The                         Colonoscope  was introduced through the anus and                         advanced to the the cecum, identified by appendiceal                         orifice and ileocecal valve. The colonoscopy was                         performed without difficulty. The patient tolerated                         the procedure well. The quality of the bowel                         preparation was excellent. Findings:      The perianal and digital rectal examinations were normal.      A 9 mm polyp was found in the sigmoid colon. The polyp was pedunculated.       The polyp was removed with a hot snare. Resection and retrieval were       complete.      A 2 mm polyp was found in the sigmoid colon. The polyp was sessile. The       polyp was removed with a cold snare. Resection and retrieval were       complete. Impression:            - One 9 mm polyp in the sigmoid colon, removed with a                         hot snare. Resected and retrieved.                        - One 2 mm polyp in the sigmoid colon, removed with a                         cold snare. Resected and retrieved. Recommendation:        - Discharge patient to home.                        - Resume previous diet.                        - Continue present medications.                        - Await pathology results.                        - If the pathology report reveals adenomatous tissue,  then repeat the colonoscopy for surveillance in 7                         years otherwise 10 years. Procedure Code(s):     --- Professional ---                        [id number], Colonoscopy, flexible; with removal of                         tumor(s), polyp(s), or other lesion(s) by snare                         technique Diagnosis Code(s):     --- Professional ---                        Z12.11, Encounter for screening for malignant neoplasm                         of colon                        K63.5, Polyp of colon CPT copyright 2019 American  Medical Association. All rights reserved. The codes documented in this report are preliminary and upon coder review may  be revised to meet current compliance requirements. [name] MD, MD 01/02/2022 8:37:55 AM This report has been signed electronically. Number of Addenda: 0 Note Initiated On: 01/02/2022 7:58 AM Scope Withdrawal Time: 0 hours 9 minutes 3 seconds  Total Procedure Duration: 0 hours 11 minutes 32 seconds  Estimated Blood Loss:  Estimated blood loss: none.      [facility]

## 2022-01-02 NOTE — Anesthesia Preprocedure Evaluation (Signed)
Anesthesia Evaluation    Airway Mallampati: II  TM Distance: >3 FB Neck ROM: Full    Dental   Pulmonary asthma ,    Pulmonary exam normal        Cardiovascular hypertension, Normal cardiovascular exam     Neuro/Psych    GI/Hepatic GERD  ,  Endo/Other    Renal/GU      Musculoskeletal   Abdominal   Peds  Hematology   Anesthesia Other Findings   Reproductive/Obstetrics                             Anesthesia Physical Anesthesia Plan  ASA: 2  Anesthesia Plan: General   Post-op Pain Management:    Induction:   PONV Risk Score and Plan:   Airway Management Planned: Natural Airway  Additional Equipment:   Intra-op Plan:   Post-operative Plan:   Informed Consent: I have reviewed the patients History and Physical, chart, labs and discussed the procedure including the risks, benefits and alternatives for the proposed anesthesia with the patient or authorized representative who has indicated his/her understanding and acceptance.       Plan Discussed with: CRNA and Anesthesiologist  Anesthesia Plan Comments:         Anesthesia Quick Evaluation

## 2022-01-05 ENCOUNTER — Encounter: Payer: Self-pay | Admitting: Gastroenterology

## 2022-01-05 ENCOUNTER — Telehealth: Payer: Self-pay

## 2022-01-05 LAB — SURGICAL PATHOLOGY

## 2022-01-05 NOTE — Telephone Encounter (Signed)
Pt last saw Dr Einar Pheasant on 07/31/21 for  annual; on 96/43/83 annual with Elmo Putt Copland PA [facility]; do not see where pt has done [facility] appt but per appt notes pt has appt for NP to establich care with Dr Otilio Miu in [place]. Sending note to Dr Damita Dunnings because got surgical pathology report. Also to [facility] CMA.

## 2022-01-05 NOTE — Telephone Encounter (Signed)
The path report was directed to Dr. Allen Norris.  I will route this to him to address.  I thank all involved.

## 2022-01-06 ENCOUNTER — Encounter: Payer: Self-pay | Admitting: Gastroenterology

## 2022-01-06 NOTE — Telephone Encounter (Signed)
[name], MD  You 1 hour ago (6:35 AM)   Not really sure why you were sent this pathology also but I did receive it previously and had already set the patient up for a repeat colonoscopy in 7 years based on the pathology.  Thanks.  Darren    Appreciate GI input.   I'll defer.

## 2022-01-07 ENCOUNTER — Other Ambulatory Visit: Payer: Self-pay

## 2022-01-07 ENCOUNTER — Other Ambulatory Visit: Payer: Self-pay | Admitting: Obstetrics and Gynecology

## 2022-01-07 ENCOUNTER — Ambulatory Visit
Admission: RE | Admit: 2022-01-07 | Discharge: 2022-01-07 | Disposition: A | Discharge status: Home / Self Care | Payer: Managed Care, Other (non HMO) | Source: Ambulatory Visit | Attending: Obstetrics and Gynecology | Admitting: Obstetrics and Gynecology

## 2022-01-07 DIAGNOSIS — N6310 Unspecified lump in the right breast, unspecified quadrant: Secondary | ICD-10-CM | POA: Diagnosis present

## 2022-01-07 DIAGNOSIS — R928 Other abnormal and inconclusive findings on diagnostic imaging of breast: Secondary | ICD-10-CM | POA: Insufficient documentation

## 2022-01-07 DIAGNOSIS — N6320 Unspecified lump in the left breast, unspecified quadrant: Secondary | ICD-10-CM

## 2022-01-21 ENCOUNTER — Ambulatory Visit
Admission: RE | Admit: 2022-01-21 | Discharge: 2022-01-21 | Disposition: A | Discharge status: Home / Self Care | Payer: Managed Care, Other (non HMO) | Source: Ambulatory Visit | Attending: Obstetrics and Gynecology | Admitting: Obstetrics and Gynecology

## 2022-01-21 ENCOUNTER — Other Ambulatory Visit: Payer: Self-pay

## 2022-01-21 DIAGNOSIS — N6310 Unspecified lump in the right breast, unspecified quadrant: Secondary | ICD-10-CM

## 2022-01-21 DIAGNOSIS — R928 Other abnormal and inconclusive findings on diagnostic imaging of breast: Secondary | ICD-10-CM

## 2022-01-21 HISTORY — PX: BREAST BIOPSY: SHX20

## 2022-01-22 LAB — SURGICAL PATHOLOGY

## 2022-02-03 ENCOUNTER — Other Ambulatory Visit: Payer: Self-pay | Admitting: Family

## 2022-03-19 ENCOUNTER — Encounter: Payer: Self-pay | Admitting: Family Medicine

## 2022-03-19 ENCOUNTER — Ambulatory Visit: Payer: Managed Care, Other (non HMO) | Admitting: Family Medicine

## 2022-03-19 VITALS — BP 130/70 | HR 95 | Ht 66.0 in | Wt 182.0 lb

## 2022-03-19 DIAGNOSIS — J4521 Mild intermittent asthma with (acute) exacerbation: Secondary | ICD-10-CM | POA: Diagnosis not present

## 2022-03-19 DIAGNOSIS — I10 Essential (primary) hypertension: Secondary | ICD-10-CM | POA: Diagnosis not present

## 2022-03-19 MED ORDER — ALBUTEROL SULFATE HFA 108 (90 BASE) MCG/ACT IN AERS: 2.0000 | INHALATION_SPRAY | Freq: Four times a day (QID) | RESPIRATORY_TRACT | 0 refills | Status: DC | PRN

## 2022-03-19 MED ORDER — AMLODIPINE BESYLATE 2.5 MG PO TABS: 2.5000 mg | ORAL_TABLET | Freq: Every day | ORAL | 1 refills | Status: DC

## 2022-03-19 NOTE — Patient Instructions (Addendum)
Managing Your Hypertension ?Hypertension, also called high blood pressure, is when the force of the blood pressing against the walls of the arteries is too strong. Arteries are blood vessels that carry blood from your heart throughout your body. Hypertension forces the heart to work harder to pump blood and may cause the arteries to become narrow or stiff. ?Understanding blood pressure readings ?Your personal target blood pressure may vary depending on your medical conditions, your age, and other factors. A blood pressure reading includes a higher number over a lower number. Ideally, your blood pressure should be below 120/80. You should know that: ?The first, or top, number is called the systolic pressure. It is a measure of the pressure in your arteries as your heart beats. ?The second, or bottom number, is called the diastolic pressure. It is a measure of the pressure in your arteries as the heart relaxes. ?Blood pressure is classified into four stages. Based on your blood pressure reading, your health care provider may use the following stages to determine what type of treatment you need, if any. Systolic pressure and diastolic pressure are measured in a unit called mmHg. ?Normal ?Systolic pressure: below 914. ?Diastolic pressure: below 80. ?Elevated ?Systolic pressure: 782-956. ?Diastolic pressure: below 80. ?Hypertension stage 1 ?Systolic pressure: 213-086. ?Diastolic pressure: 57-84. ?Hypertension stage 2 ?Systolic pressure: 696 or above. ?Diastolic pressure: 90 or above. ?How can this condition affect me? ?Managing your hypertension is an important responsibility. Over time, hypertension can damage the arteries and decrease blood flow to important parts of the body, including the brain, heart, and kidneys. Having untreated or uncontrolled hypertension can lead to: ?A heart attack. ?A stroke. ?A weakened blood vessel (aneurysm). ?Heart failure. ?Kidney damage. ?Eye damage. ?Metabolic syndrome. ?Memory and  concentration problems. ?Vascular dementia. ?What actions can I take to manage this condition? ?Hypertension can be managed by making lifestyle changes and possibly by taking medicines. Your health care provider will help you make a plan to bring your blood pressure within a normal range. ?Nutrition ? ?Eat a diet that is high in fiber and potassium, and low in salt (sodium), added sugar, and fat. An example eating plan is called the Dietary Approaches to Stop Hypertension (DASH) diet. To eat this way: ?Eat plenty of fresh fruits and vegetables. Try to fill one-half of your plate at each meal with fruits and vegetables. ?Eat whole grains, such as whole-wheat pasta, brown rice, or whole-grain bread. Fill about one-fourth of your plate with whole grains. ?Eat low-fat dairy products. ?Avoid fatty cuts of meat, processed or cured meats, and poultry with skin. Fill about one-fourth of your plate with lean proteins such as fish, chicken without skin, beans, eggs, and tofu. ?Avoid pre-made and processed foods. These tend to be higher in sodium, added sugar, and fat. ?Reduce your daily sodium intake. Most people with hypertension should eat less than 1,500 mg of sodium a day. ?Lifestyle ? ?Work with your health care provider to maintain a healthy body weight or to lose weight. Ask what an ideal weight is for you. ?Get at least 30 minutes of exercise that causes your heart to beat faster (aerobic exercise) most days of the week. Activities may include walking, swimming, or biking. ?Include exercise to strengthen your muscles (resistance exercise), such as weight lifting, as part of your weekly exercise routine. Try to do these types of exercises for 30 minutes at least 3 days a week. ?Do not use any products that contain nicotine or tobacco, such as cigarettes, e-cigarettes,  and chewing tobacco. If you need help quitting, ask your health care provider. ?Control any long-term (chronic) conditions you have, such as high  cholesterol or diabetes. ?Identify your sources of stress and find ways to manage stress. This may include meditation, deep breathing, or making time for fun activities. ?Alcohol use ?Do not drink alcohol if: ?Your health care provider tells you not to drink. ?You are pregnant, may be pregnant, or are planning to become pregnant. ?If you drink alcohol: ?Limit how much you use to: ?0-1 drink a day for women. ?0-2 drinks a day for men. ?Be aware of how much alcohol is in your drink. In the [place] one drink equals one 12 oz bottle of beer (355 mL), one 5 oz glass of wine (148 mL), or one 1? oz glass of hard liquor (44 mL). ?Medicines ?Your health care provider may prescribe medicine if lifestyle changes are not enough to get your blood pressure under control and if: ?Your systolic blood pressure is 130 or higher. ?Your diastolic blood pressure is 80 or higher. ?Take medicines only as told by your health care provider. Follow the directions carefully. Blood pressure medicines must be taken as told by your health care provider. The medicine does not work as well when you skip doses. Skipping doses also puts you at risk for problems. ?Monitoring ?Before you monitor your blood pressure: ?Do not smoke, drink caffeinated beverages, or exercise within 30 minutes before taking a measurement. ?Use the bathroom and empty your bladder (urinate). ?Sit quietly for at least 5 minutes before taking measurements. ?Monitor your blood pressure at home as told by your health care provider. To do this: ?Sit with your back straight and supported. ?Place your feet flat on the floor. Do not cross your legs. ?Support your arm on a flat surface, such as a table. Make sure your upper arm is at heart level. ?Each time you measure, take two or three readings one minute apart and record the results. ?You may also need to have your blood pressure checked regularly by your health care provider. ?General information ?Talk with your health care  provider about your diet, exercise habits, and other lifestyle factors that may be contributing to hypertension. ?Review all the medicines you take with your health care provider because there may be side effects or interactions. ?Keep all visits as told by your health care provider. Your health care provider can help you create and adjust your plan for managing your high blood pressure. ?Where to find more information ?National Heart, Lung, and Blood Institute: https://wilson-eaton.com/ ?American Heart Association: www.heart.org ?Contact a health care provider if: ?You think you are having a reaction to medicines you have taken. ?You have repeated (recurrent) headaches. ?You feel dizzy. ?You have swelling in your ankles. ?You have trouble with your vision. ?Get help right away if: ?You develop a severe headache or confusion. ?You have unusual weakness or numbness, or you feel faint. ?You have severe pain in your chest or abdomen. ?You vomit repeatedly. ?You have trouble breathing. ?These symptoms may represent a serious problem that is an emergency. Do not wait to see if the symptoms will go away. Get medical help right away. Call your local emergency services (911 in the [place] Do not drive yourself to the hospital. ?Summary ?Hypertension is when the force of blood pumping through your arteries is too strong. If this condition is not controlled, it may put you at risk for serious complications. ?Your personal target blood pressure may vary depending on  your medical conditions, your age, and other factors. For most people, a normal blood pressure is less than 120/80. ?Hypertension is managed by lifestyle changes, medicines, or both. ?Lifestyle changes to help manage hypertension include losing weight, eating a healthy, low-sodium diet, exercising more, stopping smoking, and limiting alcohol. ?This information is not intended to replace advice given to you by your health care provider. Make sure you discuss any questions  you have with your health care provider. ?Document Revised: 12/25/2019 Document Reviewed: 11/07/2019 ?Elsevier Patient Education ? [place]. ?How to Take Your Blood Pressure ?Blood pressure measures how strongl

## 2022-03-19 NOTE — Progress Notes (Signed)
? ? ?Date:  03/19/2022  ? ?Name:  Nicole Harrell   DOB:  14-Nov-1971   MRN:  [id number] ? ? ?Chief Complaint: Establish Care and Hypertension ? ?HPI ? ?Lab Results  ?Component Value Date  ? NA 135 06/20/2021  ? K 3.9 06/20/2021  ? CO2 19 (L) 06/20/2021  ? GLUCOSE 88 06/20/2021  ? BUN 14 06/20/2021  ? CREATININE 0.71 06/20/2021  ? CALCIUM 9.5 06/20/2021  ? EGFR 104 06/20/2021  ? ?Lab Results  ?Component Value Date  ? CHOL 226 (H) 07/31/2021  ? HDL 45 07/31/2021  ? LDLCALC 135 (H) 07/31/2021  ? TRIG 259 (H) 07/31/2021  ? CHOLHDL 5.0 (H) 07/31/2021  ? ?Lab Results  ?Component Value Date  ? TSH 2.380 06/20/2021  ? ?Lab Results  ?Component Value Date  ? HGBA1C 5.5 10/04/2019  ? ?Lab Results  ?Component Value Date  ? WBC 7.2 06/20/2021  ? HGB 13.8 06/20/2021  ? HCT 40.6 06/20/2021  ? MCV 90 06/20/2021  ? PLT 283 06/20/2021  ? ?No results found for: ALT, AST, GGT, ALKPHOS, BILITOT ?Lab Results  ?Component Value Date  ? VD25OH 69.4 10/04/2019  ?  ? ?Review of Systems  ?Eyes:  Positive for pain.  ?Respiratory:  Negative for shortness of breath.   ?Cardiovascular:  Negative for chest pain and leg swelling.  ?Gastrointestinal:  Negative for abdominal pain.  ? ?Patient Active Problem List  ? Diagnosis Date Noted  ? Colon cancer screening   ? Polyp of sigmoid colon   ? Glaucoma 07/31/2021  ? Overweight (BMI 25.0-29.9) 07/31/2021  ? Prediabetes 07/31/2021  ? Hypertension 06/23/2021  ? Increased risk of breast cancer 06/21/2019  ? Family history of breast cancer 06/21/2019  ? Allergic rhinitis 11/23/2016  ? GERD (gastroesophageal reflux disease) 11/23/2016  ? Reactive airway disease 11/23/2016  ? Reactive cervical lymphadenopathy 11/23/2016  ? ? ?Allergies  ?Allergen Reactions  ? Iodinated Contrast Media Hives  ? Other Hives  ?  Seasonal, cats, dogs  ? Penicillins Hives  ? ? ?Past Surgical History:  ?Procedure Laterality Date  ? BREAST BIOPSY Right 01/21/2022  ? [place] bx, heart marker, path pending  ? COLONOSCOPY WITH PROPOFOL N/A  01/02/2022  ? Procedure: COLONOSCOPY WITH PROPOFOL;  Surgeon: [name], MD;  Location: [facility];  Service: Endoscopy;  Laterality: N/A;  ? LYMPH NODE BIOPSY    ? POLYPECTOMY N/A 01/02/2022  ? Procedure: POLYPECTOMY;  Surgeon: [name], MD;  Location: [facility];  Service: Endoscopy;  Laterality: N/A;  ? ? ?Social History  ? ?Tobacco Use  ? Smoking status: Never  ? Smokeless tobacco: Never  ?Vaping Use  ? Vaping Use: Never used  ?Substance Use Topics  ? Alcohol use: No  ? Drug use: No  ? ? ? ?Medication list has been reviewed and updated. ? ?Current Meds  ?Medication Sig  ? ADVAIR DISKUS 250-50 MCG/ACT AEPB Inhale 1 puff into the lungs 2 (two) times daily.  ? albuterol (VENTOLIN HFA) 108 (90 Base) MCG/ACT inhaler Inhale 2 puffs into the lungs every 6 (six) hours as needed for wheezing or shortness of breath.  ? amLODipine (NORVASC) 2.5 MG tablet Take 1 tablet (2.5 mg total) by mouth daily.  ? cetirizine (ZYRTEC) 10 MG tablet Take 1 tablet by mouth daily.  ? Cholecalciferol (VITAMIN D-3) 5000 units TABS Take by mouth.  ? montelukast (SINGULAIR) 10 MG tablet Take 10 mg by mouth at bedtime.  ? Multiple Vitamin (MULTIVITAMIN ADULT) TABS See admin instructions.  ?  norethindrone (MICRONOR) 0.35 MG tablet Take 1 tablet (0.35 mg total) by mouth daily.  ? SIMBRINZA 1-0.2 % SUSP Apply 1 drop to eye 2 (two) times daily.  ? ? ? ?  03/19/2022  ?  3:19 PM  ?GAD 7 : Generalized Anxiety Score  ?Nervous, Anxious, on Edge 0  ?Control/stop worrying 0  ?Worry too much - different things 0  ?Trouble relaxing 0  ?Restless 0  ?Easily annoyed or irritable 0  ?Afraid - awful might happen 0  ?Total GAD 7 Score 0  ?Anxiety Difficulty Not difficult at all  ? ? ? ?  03/19/2022  ?  3:19 PM  ?Depression screen PHQ 2/9  ?Decreased Interest 0  ?Down, Depressed, Hopeless 0  ?PHQ - 2 Score 0  ?Altered sleeping 0  ?Tired, decreased energy 0  ?Change in appetite 0  ?Feeling bad or failure about yourself  0  ?Trouble  concentrating 0  ?Moving slowly or fidgety/restless 0  ?Suicidal thoughts 0  ?PHQ-9 Score 0  ?Difficult doing work/chores Not difficult at all  ? ? ?BP Readings from Last 3 Encounters:  ?03/19/22 130/70  ?01/02/22 113/73  ?12/09/21 130/70  ? ? ?Physical Exam ?Vitals and nursing note reviewed. Exam conducted with a chaperone present.  ?Constitutional:   ?   General: She is not in acute distress. ?   Appearance: She is not diaphoretic.  ?HENT:  ?   Head: Normocephalic and atraumatic.  ?   Right Ear: External ear normal.  ?   Left Ear: External ear normal.  ?   Nose: Nose normal.  ?Eyes:  ?   General:     ?   Right eye: No discharge.     ?   Left eye: No discharge.  ?   Conjunctiva/sclera: Conjunctivae normal.  ?   Pupils: Pupils are equal, round, and reactive to light.  ?Neck:  ?   Thyroid: No thyromegaly.  ?   Vascular: No JVD.  ?Cardiovascular:  ?   Rate and Rhythm: Normal rate and regular rhythm.  ?   Heart sounds: Normal heart sounds. No murmur heard. ?  No friction rub. No gallop.  ?Pulmonary:  ?   Effort: Pulmonary effort is normal.  ?   Breath sounds: Normal breath sounds.  ?Abdominal:  ?   General: Bowel sounds are normal.  ?   Palpations: Abdomen is soft. There is no mass.  ?   Tenderness: There is no abdominal tenderness. There is no guarding.  ?Musculoskeletal:  ?   Cervical back: Normal range of motion and neck supple.  ?Lymphadenopathy:  ?   Cervical: No cervical adenopathy.  ?Skin: ?   General: Skin is warm.  ?Neurological:  ?   Mental Status: She is alert.  ?   Deep Tendon Reflexes: Reflexes are normal and symmetric.  ? ? ?Wt Readings from Last 3 Encounters:  ?03/19/22 182 lb (82.6 kg)  ?01/02/22 181 lb (82.1 kg)  ?12/09/21 183 lb (83 kg)  ? ? ?BP 130/70 (BP Location: Left Arm, Cuff Size: Large)   Pulse 95   Ht '5\' 6"'  (1.676 m)   Wt 182 lb (82.6 kg)   LMP 03/04/2022 (Approximate)   SpO2 99%   BMI 29.38 kg/m?  ? ?Assessment and Plan: ?1. Hypertension, unspecified type ?Chronic.  Persistent.   Controlled.  Blood pressure is acceptable at 130/70.  Continue Norvasc 2.5 mg once a day.  Discussed the advantage of losing weight, avoiding sodium, regular exercise, and continuance of current  medical regimen.  We will recheck in 8 weeks. ?- amLODipine (NORVASC) 2.5 MG tablet; Take 1 tablet (2.5 mg total) by mouth daily.  Dispense: 90 tablet; Refill: 1 ? ?2. Mild intermittent reactive airway disease with acute exacerbation ?Chronic.  Intermittent.  Stable.  Currently on combination of Advair and albuterol which she takes the second inhaler on a as needed basis.  I have encouraged her given the pollen count to go ahead and push the albuterol.  Given her recent diagnosis of glaucoma and the inhaled steroid patient is going to inquire whether this will be a continuance of the steroid portion of the inhaler with her pulmonologist.  We will hold on refilling Advair and that she does have pending any of this until she sees her pulmonologist but I have refilled her albuterol. ?- albuterol (VENTOLIN HFA) 108 (90 Base) MCG/ACT inhaler; Inhale 2 puffs into the lungs every 6 (six) hours as needed for wheezing or shortness of breath.  Dispense: 16 g; Refill: 0  ? ? ? ? ?

## 2022-04-24 ENCOUNTER — Other Ambulatory Visit: Payer: Self-pay | Admitting: Family

## 2022-04-24 DIAGNOSIS — I10 Essential (primary) hypertension: Secondary | ICD-10-CM

## 2022-05-16 ENCOUNTER — Other Ambulatory Visit: Payer: Self-pay | Admitting: Family Medicine

## 2022-05-16 DIAGNOSIS — J4521 Mild intermittent asthma with (acute) exacerbation: Secondary | ICD-10-CM

## 2022-05-21 ENCOUNTER — Ambulatory Visit: Payer: Managed Care, Other (non HMO) | Admitting: Family Medicine

## 2022-07-21 LAB — BASIC METABOLIC PANEL
Creatinine: 0.6 (ref 0.5–1.1)
Glucose: 87

## 2022-07-21 LAB — LIPID PANEL
Cholesterol: 180 (ref 0–200)
HDL: 51 (ref 35–70)
LDL Cholesterol: 110
Triglycerides: 106 (ref 40–160)

## 2022-07-21 LAB — COMPREHENSIVE METABOLIC PANEL: eGFR: 108

## 2022-07-21 LAB — HEMOGLOBIN A1C: Hemoglobin A1C: 5.7

## 2022-07-23 ENCOUNTER — Ambulatory Visit
Admission: RE | Admit: 2022-07-23 | Discharge: 2022-07-23 | Disposition: A | Discharge status: Home / Self Care | Payer: Managed Care, Other (non HMO) | Source: Ambulatory Visit | Attending: Obstetrics and Gynecology | Admitting: Obstetrics and Gynecology

## 2022-07-23 DIAGNOSIS — N6310 Unspecified lump in the right breast, unspecified quadrant: Secondary | ICD-10-CM | POA: Insufficient documentation

## 2022-07-23 DIAGNOSIS — R928 Other abnormal and inconclusive findings on diagnostic imaging of breast: Secondary | ICD-10-CM

## 2022-07-25 ENCOUNTER — Encounter: Payer: Self-pay | Admitting: Obstetrics and Gynecology

## 2022-08-18 NOTE — Telephone Encounter (Deleted)
error 

## 2022-08-27 NOTE — Telephone Encounter (Deleted)
error 

## 2022-09-09 NOTE — Telephone Encounter (Deleted)
ERROR

## 2022-09-27 ENCOUNTER — Other Ambulatory Visit: Payer: Self-pay | Admitting: Obstetrics and Gynecology

## 2022-09-27 DIAGNOSIS — Z3041 Encounter for surveillance of contraceptive pills: Secondary | ICD-10-CM

## 2022-10-30 NOTE — Telephone Encounter (Signed)
Nicole Harrell - are you able to sign encounter to close?

## 2022-11-06 ENCOUNTER — Encounter: Payer: Self-pay | Admitting: Internal Medicine

## 2022-11-06 ENCOUNTER — Ambulatory Visit (INDEPENDENT_AMBULATORY_CARE_PROVIDER_SITE_OTHER): Payer: Managed Care, Other (non HMO) | Admitting: Internal Medicine

## 2022-11-06 VITALS — BP 145/81 | HR 77 | Temp 97.1°F | Ht 66.0 in | Wt 182.0 lb

## 2022-11-06 DIAGNOSIS — E663 Overweight: Secondary | ICD-10-CM

## 2022-11-06 DIAGNOSIS — H409 Unspecified glaucoma: Secondary | ICD-10-CM | POA: Diagnosis not present

## 2022-11-06 DIAGNOSIS — R7303 Prediabetes: Secondary | ICD-10-CM

## 2022-11-06 DIAGNOSIS — J4521 Mild intermittent asthma with (acute) exacerbation: Secondary | ICD-10-CM

## 2022-11-06 DIAGNOSIS — K219 Gastro-esophageal reflux disease without esophagitis: Secondary | ICD-10-CM | POA: Diagnosis not present

## 2022-11-06 DIAGNOSIS — I10 Essential (primary) hypertension: Secondary | ICD-10-CM

## 2022-11-06 DIAGNOSIS — H9319 Tinnitus, unspecified ear: Secondary | ICD-10-CM | POA: Insufficient documentation

## 2022-11-06 DIAGNOSIS — Z6829 Body mass index (BMI) 29.0-29.9, adult: Secondary | ICD-10-CM

## 2022-11-06 DIAGNOSIS — H9313 Tinnitus, bilateral: Secondary | ICD-10-CM

## 2022-11-06 DIAGNOSIS — E782 Mixed hyperlipidemia: Secondary | ICD-10-CM | POA: Insufficient documentation

## 2022-11-06 MED ORDER — AMLODIPINE BESYLATE 10 MG PO TABS: 10.0000 mg | ORAL_TABLET | Freq: Every day | ORAL | 1 refills | Status: DC

## 2022-11-06 NOTE — Progress Notes (Signed)
Subjective:    Patient ID: Nicole Harrell, female    DOB: 1971-02-06, 51 y.o.   MRN: [id number]  HPI  Patient presents to clinic today to establish care and for management of the conditions listed below.  HTN: Her BP today is 145/81.  She is taking Amlodipine as prescribed.  There is no ECG on file.  Reactive Airway Disease: Managed with Advair and Albuterol.  There are no PFTs on file.  She follows with pulmonology.  GERD: Currently not an issue. She is not taking any medication for this. There is no upper GI on file.  Prediabetes: Her last A1c was 5.5, 09/2019.  She is not taking any oral diabetic medication at this time.  She does not check her sugars.  Glaucoma: Managed with eyedrops prescribed by ophthalmology.  HLD: Her last LDL was 135, triglycerides 259, 06/2021. She is not taking any oral cholesterol-lowering medication at this time.  She tries to consume low-fat diet.  Review of Systems     Past Medical History:  Diagnosis Date   Asthma    BRCA negative 2012, 2/19   BRCA neg 2012; MyRIsk neg 2019   Depression    Family history of breast cancer    Family history of ovarian cancer    6/12 BRCA Neg   Glaucoma    Hypertension    Increased risk of breast cancer 01/2018   IBIS=21%/riskscore=34.5%   Plantar fasciitis     Current Outpatient Medications  Medication Sig Dispense Refill   ADVAIR DISKUS 250-50 MCG/ACT AEPB Inhale 1 puff into the lungs 2 (two) times daily.     albuterol (VENTOLIN HFA) 108 (90 Base) MCG/ACT inhaler TAKE 2 PUFFS BY MOUTH EVERY 6 HOURS AS NEEDED FOR WHEEZE OR SHORTNESS OF BREATH 8.5 each 1   amLODipine (NORVASC) 2.5 MG tablet Take 1 tablet (2.5 mg total) by mouth daily. 90 tablet 1   cetirizine (ZYRTEC) 10 MG tablet Take 1 tablet by mouth daily.     Cholecalciferol (VITAMIN D-3) 5000 units TABS Take by mouth.     montelukast (SINGULAIR) 10 MG tablet Take 10 mg by mouth at bedtime.     Multiple Vitamin (MULTIVITAMIN ADULT) TABS See  admin instructions.     norethindrone (MICRONOR) 0.35 MG tablet Take 1 tablet (0.35 mg total) by mouth daily. 84 tablet 3   SIMBRINZA 1-0.2 % SUSP Apply 1 drop to eye 2 (two) times daily.     No current facility-administered medications for this visit.    Allergies  Allergen Reactions   Iodinated Contrast Media Hives   Other Hives    Seasonal, cats, dogs   Penicillins Hives    Family History  Problem Relation Age of Onset   Breast cancer Mother 37   Asthma Mother    Cancer Mother    Diabetes Mother    Heart attack Mother    Cancer Father    Hyperlipidemia Father    Hypertension Father    Asthma Brother    Depression Brother    Hypertension Brother    Learning disabilities Brother    Miscarriages / Stillbirths Brother    Mental illness Brother    Breast cancer Paternal Aunt 27   Ovarian cancer Paternal Aunt 37   Colon cancer Paternal Uncle 90   Alzheimer's disease Maternal Grandmother    Cancer Paternal Grandmother        stomach    Social History   Socioeconomic History   Marital status: Married  Spouse name: Not on file   Number of children: Not on file   Years of education: Not on file   Highest education level: Not on file  Occupational History   Not on file  Tobacco Use   Smoking status: Never   Smokeless tobacco: Never  Vaping Use   Vaping Use: Never used  Substance and Sexual Activity   Alcohol use: No   Drug use: No   Sexual activity: Yes    Birth control/protection: Pill  Other Topics Concern   Not on file  Social History Narrative   Works at [employer], Conservation officer, historic buildings.     Social Determinants of Health   Financial Resource Strain: Not on file  Food Insecurity: Not on file  Transportation Needs: Not on file  Physical Activity: Not on file  Stress: Not on file  Social Connections: Not on file  Intimate Partner Violence: Not on file     Constitutional: Denies fever, malaise, fatigue, headache or abrupt weight changes.  HEENT: Pt  reports ringing in the ears. Denies eye pain, eye redness, ear pain, wax buildup, runny nose, nasal congestion, bloody nose, or sore throat. Respiratory: Denies difficulty breathing, shortness of breath, cough or sputum production.   Cardiovascular: Denies chest pain, chest tightness, palpitations or swelling in the hands or feet.  Gastrointestinal: Patient reports intermittent reflux.  Denies abdominal pain, bloating, constipation, diarrhea or blood in the stool.  GU: Denies urgency, frequency, pain with urination, burning sensation, blood in urine, odor or discharge. Musculoskeletal: Denies decrease in range of motion, difficulty with gait, muscle pain or joint pain and swelling.  Skin: Denies redness, rashes, lesions or ulcercations.  Neurological: Denies dizziness, difficulty with memory, difficulty with speech or problems with balance and coordination.  Psych: Denies anxiety, depression, SI/HI.  No other specific complaints in a complete review of systems (except as listed in HPI above).  Objective:   Physical Exam  BP (!) 145/81 (BP Location: Left Arm, Patient Position: Sitting, Cuff Size: Normal)   Pulse 77   Temp (!) 97.1 F (36.2 C) (Temporal)   Ht _0  (1.676 m)   Wt 182 lb (82.6 kg)   SpO2 96%   BMI 29.38 kg/m   Wt Readings from Last 3 Encounters:  03/19/22 182 lb (82.6 kg)  01/02/22 181 lb (82.1 kg)  12/09/21 183 lb (83 kg)    General: Appears her stated age, weight, in NAD. Skin: Warm, dry and intact.  HEENT: Head: normal shape and size; Eyes: sclera white, no icterus, conjunctiva pink, PERRLA and EOMs intact; Ears: Tm's gray and intact, normal light reflex, normal Rinne, normal Weber; Neck:  Neck supple, trachea midline. No masses, lumps or thyromegaly present.  Cardiovascular: Normal rate and rhythm. Pulmonary/Chest: Normal effort and positive vesicular breath sounds.  Musculoskeletal: No difficulty with gait.  Neurological: Alert and oriented.   Psychiatric:  Mood and affect normal. Behavior is normal. Judgment and thought content normal.    BMET    Component Value Date/Time   NA 135 06/20/2021 1630   K 3.9 06/20/2021 1630   CL 102 06/20/2021 1630   CO2 19 (L) 06/20/2021 1630   GLUCOSE 88 06/20/2021 1630   BUN 14 06/20/2021 1630   CREATININE 0.71 06/20/2021 1630   CALCIUM 9.5 06/20/2021 1630    Lipid Panel     Component Value Date/Time   CHOL 226 (H) 07/31/2021 1507   TRIG 259 (H) 07/31/2021 1507   HDL 45 07/31/2021 1507   CHOLHDL 5.0 (  H) 07/31/2021 1507   LDLCALC 135 (H) 07/31/2021 1507    CBC    Component Value Date/Time   WBC 7.2 06/20/2021 1630   RBC 4.49 06/20/2021 1630   HGB 13.8 06/20/2021 1630   HCT 40.6 06/20/2021 1630   PLT 283 06/20/2021 1630   MCV 90 06/20/2021 1630   MCH 30.7 06/20/2021 1630   MCHC 34.0 06/20/2021 1630   RDW 11.4 (L) 06/20/2021 1630   LYMPHSABS 2.6 06/20/2021 1630   EOSABS 0.1 06/20/2021 1630   BASOSABS 0.0 06/20/2021 1630    Hgb A1C Lab Results  Component Value Date   HGBA1C 5.5 10/04/2019           Assessment & Plan:      RTC in 2 weeks for blood pressure check, 6 months for your annual exam Webb Silversmith, NP

## 2022-11-06 NOTE — Assessment & Plan Note (Signed)
Continue Advair and albuterol

## 2022-11-06 NOTE — Assessment & Plan Note (Signed)
Encourage diet and exercise for weight loss 

## 2022-11-06 NOTE — Assessment & Plan Note (Signed)
Currently not an issue °We will monitor °

## 2022-11-06 NOTE — Assessment & Plan Note (Signed)
She will continue to follow with ophthalmology 

## 2022-11-06 NOTE — Assessment & Plan Note (Signed)
Uncontrolled Increase amlodipine to 10 mg daily Reinforced DASH diet and exercise for weight loss

## 2022-11-06 NOTE — Assessment & Plan Note (Signed)
Normal ear exam Normal Rinne and Weber Discussed referral to ENT versus audiology but she will hold off at this time

## 2022-11-06 NOTE — Assessment & Plan Note (Signed)
She will upload her A1c from 07/2022 from her biometric screening to [facility] and exercise for weight loss

## 2022-11-06 NOTE — Patient Instructions (Signed)
Tinnitus ?Tinnitus refers to hearing a sound when there is no actual source for that sound. This is often described as ringing in the ears. However, people with this condition may hear a variety of noises, in one ear or in both ears. ?The sounds of tinnitus can be soft, loud, or somewhere in between. Tinnitus can last for a few seconds or can be constant for days. It may go away without treatment and come back at various times. When tinnitus is constant or happens often, it can lead to other problems, such as trouble sleeping and trouble concentrating. ?Almost everyone experiences tinnitus at some point. Tinnitus is not the same as hearing loss. Tinnitus that is long-lasting (chronic) or comes back often (recurs) may require medical attention. ?What are the causes? ?The cause of tinnitus is often not known. In some cases, it can result from: ?Exposure to loud noises from machinery, music, or other sources. ?An object (foreign body) stuck in the ear. ?Earwax buildup. ?Drinking alcohol or caffeine. ?Taking certain medicines. ?Age-related hearing loss. ?It may also be caused by medical conditions such as: ?Ear or sinus infections. ?Heart diseases or high blood pressure. ?Allergies. ?M?ni?re's disease. ?Thyroid problems. ?Tumors. ?A weak, bulging blood vessel (aneurysm) near the ear. ?What increases the risk? ?The following factors may make you more likely to develop this condition: ?Exposure to loud noises. ?Age. Tinnitus is more likely in older individuals. ?Using alcohol or tobacco. ?What are the signs or symptoms? ?The main symptom of tinnitus is hearing a sound when there is no source for that sound. It may sound like: ?Buzzing. ?Sizzling. ?Ringing. ?Blowing air. ?Hissing. ?Whistling. ?Other sounds may include: ?Roaring. ?Running water. ?A musical note. ?Tapping. ?Humming. ?Symptoms may affect only one ear (unilateral) or both ears (bilateral). ?How is this diagnosed? ?Tinnitus is diagnosed based on your symptoms,  your medical history, and a physical exam. Your health care provider may do a thorough hearing test (audiologic exam) if your tinnitus: ?Is unilateral. ?Causes hearing difficulties. ?Lasts 6 months or longer. ?You may work with a health care provider who specializes in hearing disorders (audiologist). You may be asked questions about your symptoms and how they affect your daily life. You may have other tests done, such as: ?CT scan. ?MRI. ?An imaging test of how blood flows through your blood vessels (angiogram). ?How is this treated? ?Treating an underlying medical condition can sometimes make tinnitus go away. If your tinnitus continues, other treatments may include: ?Therapy and counseling to help you manage the stress of living with tinnitus. ?Sound generators to mask the tinnitus. These include: ?Tabletop sound machines that play relaxing sounds to help you fall asleep. ?Wearable devices that fit in your ear and play sounds or music. ?Acoustic neural stimulation. This involves using headphones to listen to music that contains an auditory signal. Over time, listening to this signal may change some pathways in your brain and make you less sensitive to tinnitus. This treatment is used for very severe cases when no other treatment is working. ?Using hearing aids or cochlear implants if your tinnitus is related to hearing loss. Hearing aids are worn in the outer ear. Cochlear implants are surgically placed in the inner ear. ?Follow these instructions at home: ?Managing symptoms ? ?  ? ?When possible, avoid being in loud places and being exposed to loud sounds. ?Wear hearing protection, such as earplugs, when you are exposed to loud noises. ?Use a white noise machine, a humidifier, or other devices to mask the sound of tinnitus. ?  Practice techniques for reducing stress, such as meditation, yoga, or deep breathing. Work with your health care provider if you need help with managing stress. ?Sleep with your head  slightly raised. This may reduce the impact of tinnitus. ?General instructions ?Do not use stimulants, such as nicotine, alcohol, or caffeine. Talk with your health care provider about other stimulants to avoid. Stimulants are substances that can make you feel alert and attentive by increasing certain activities in the body (such as heart rate and blood pressure). These substances may make tinnitus worse. ?Take over-the-counter and prescription medicines only as told by your health care provider. ?Try to get plenty of sleep each night. ?Keep all follow-up visits. This is important. ?Contact a health care provider if: ?Your tinnitus continues for 3 weeks or longer without stopping. ?You develop sudden hearing loss. ?Your symptoms get worse or do not get better with home care. ?You feel you are not able to manage the stress of living with tinnitus. ?Get help right away if: ?You develop tinnitus after a head injury. ?You have tinnitus along with any of the following: ?Dizziness. ?Nausea and vomiting. ?Loss of balance. ?Sudden, severe headache. ?Vision changes. ?Facial weakness or weakness of arms or legs. ?These symptoms may represent a serious problem that is an emergency. Do not wait to see if the symptoms will go away. Get medical help right away. Call your local emergency services (911 in the [place] Do not drive yourself to the hospital. ?Summary ?Tinnitus refers to hearing a sound when there is no actual source for that sound. This is often described as ringing in the ears. ?Symptoms may affect only one ear (unilateral) or both ears (bilateral). ?Use a white noise machine, a humidifier, or other devices to mask the sound of tinnitus. ?Do not use stimulants, such as nicotine, alcohol, or caffeine. These substances may make tinnitus worse. ?This information is not intended to replace advice given to you by your health care provider. Make sure you discuss any questions you have with your health care  provider. ?Document Revised: 11/11/2020 Document Reviewed: 11/11/2020 ?Elsevier Patient Education ? 2023 [employer] ? ?

## 2022-11-06 NOTE — Telephone Encounter (Signed)
Could you please abstract these labs?

## 2022-11-06 NOTE — Assessment & Plan Note (Signed)
She will upload lipid profile from biometric screening to your [facility]

## 2022-11-10 ENCOUNTER — Ambulatory Visit: Payer: Managed Care, Other (non HMO) | Admitting: Internal Medicine

## 2022-11-20 ENCOUNTER — Encounter: Payer: Self-pay | Admitting: Internal Medicine

## 2022-11-20 ENCOUNTER — Ambulatory Visit: Payer: Managed Care, Other (non HMO) | Admitting: Internal Medicine

## 2022-11-20 VITALS — BP 134/82 | HR 78 | Temp 97.3°F | Wt 180.0 lb

## 2022-11-20 DIAGNOSIS — E663 Overweight: Secondary | ICD-10-CM

## 2022-11-20 DIAGNOSIS — Z23 Encounter for immunization: Secondary | ICD-10-CM | POA: Diagnosis not present

## 2022-11-20 DIAGNOSIS — I10 Essential (primary) hypertension: Secondary | ICD-10-CM | POA: Diagnosis not present

## 2022-11-20 DIAGNOSIS — Z6829 Body mass index (BMI) 29.0-29.9, adult: Secondary | ICD-10-CM | POA: Diagnosis not present

## 2022-11-20 NOTE — Progress Notes (Signed)
Subjective:    Patient ID: Neenah Canter, female    DOB: 04-17-1971, 51 y.o.   MRN: [id number]  HPI  Patient presents to clinic today for 2-week follow-up of HTN.  At her last visit, her Amlodipine was increased to 10 mg daily.  She has been taking the medication as prescribed.  Her BP today is 134/82.  There is no ECG on file.  Review of Systems  Past Medical History:  Diagnosis Date   Asthma    BRCA negative 2012, 2/19   BRCA neg 2012; MyRIsk neg 2019   Depression    Family history of breast cancer    Family history of ovarian cancer    6/12 BRCA Neg   Glaucoma    Hypertension    Increased risk of breast cancer 01/2018   IBIS=21%/riskscore=34.5%   Plantar fasciitis     Current Outpatient Medications  Medication Sig Dispense Refill   ADVAIR DISKUS 250-50 MCG/ACT AEPB Inhale 1 puff into the lungs 2 (two) times daily.     albuterol (VENTOLIN HFA) 108 (90 Base) MCG/ACT inhaler TAKE 2 PUFFS BY MOUTH EVERY 6 HOURS AS NEEDED FOR WHEEZE OR SHORTNESS OF BREATH 8.5 each 1   amLODipine (NORVASC) 10 MG tablet Take 1 tablet (10 mg total) by mouth daily. 90 tablet 1   cetirizine (ZYRTEC) 10 MG tablet Take 1 tablet by mouth daily.     Cholecalciferol (VITAMIN D-3) 5000 units TABS Take by mouth.     montelukast (SINGULAIR) 10 MG tablet Take 10 mg by mouth at bedtime.     norethindrone (MICRONOR) 0.35 MG tablet Take 1 tablet (0.35 mg total) by mouth daily. 84 tablet 3   SIMBRINZA 1-0.2 % SUSP Apply 1 drop to eye 2 (two) times daily.     No current facility-administered medications for this visit.    Allergies  Allergen Reactions   Iodinated Contrast Media Hives   Other Hives    Seasonal, cats, dogs   Penicillins Hives    Family History  Problem Relation Age of Onset   Breast cancer Mother 72   Asthma Mother    Diabetes Mother    Heart attack Mother    Bladder Cancer Father    Hyperlipidemia Father    Hypertension Father    Asthma Brother    Depression Brother     Hypertension Brother    Learning disabilities Brother    Mental illness Brother    Alzheimer's disease Maternal Grandmother    Stomach cancer Paternal Grandmother    Breast cancer Paternal Aunt 6   Ovarian cancer Paternal Aunt 68   Colon cancer Paternal Uncle 48    Social History   Socioeconomic History   Marital status: Married    Spouse name: Not on file   Number of children: Not on file   Years of education: Not on file   Highest education level: Not on file  Occupational History   Not on file  Tobacco Use   Smoking status: Never   Smokeless tobacco: Never  Vaping Use   Vaping Use: Never used  Substance and Sexual Activity   Alcohol use: No   Drug use: No   Sexual activity: Yes    Birth control/protection: Pill  Other Topics Concern   Not on file  Social History Narrative   Works at [employer], Conservation officer, historic buildings.     Social Determinants of Health   Financial Resource Strain: Not on file  Food Insecurity: Not on file  Transportation Needs: Not on file  Physical Activity: Not on file  Stress: Not on file  Social Connections: Not on file  Intimate Partner Violence: Not on file     Constitutional: Denies fever, malaise, fatigue, headache or abrupt weight changes.  Respiratory: Denies difficulty breathing, shortness of breath, cough or sputum production.   Cardiovascular: Denies chest pain, chest tightness, palpitations or swelling in the hands or feet.  Neurological: Denies dizziness, difficulty with memory, difficulty with speech or problems with balance and coordination.    No other specific complaints in a complete review of systems (except as listed in HPI above).     Objective:   Physical Exam  BP 134/82 (BP Location: Left Arm, Patient Position: Sitting, Cuff Size: Normal)   Pulse 78   Temp (!) 97.3 F (36.3 C) (Temporal)   Wt 180 lb (81.6 kg)   SpO2 97%   BMI 29.05 kg/m   Wt Readings from Last 3 Encounters:  11/06/22 182 lb (82.6 kg)  03/19/22  182 lb (82.6 kg)  01/02/22 181 lb (82.1 kg)    General: Appears her stated age, overweight, in NAD. Cardiovascular: Normal rate and rhythm. S1,S2 noted.  No murmur, rubs or gallops noted. No JVD or BLE edema.  Pulmonary/Chest: Normal effort and positive vesicular breath sounds. No respiratory distress. No wheezes, rales or ronchi noted.  Neurological: Alert and oriented.  BMET    Component Value Date/Time   NA 135 06/20/2021 1630   K 3.9 06/20/2021 1630   CL 102 06/20/2021 1630   CO2 19 (L) 06/20/2021 1630   GLUCOSE 88 06/20/2021 1630   BUN 14 06/20/2021 1630   CREATININE 0.71 06/20/2021 1630   CALCIUM 9.5 06/20/2021 1630    Lipid Panel     Component Value Date/Time   CHOL 226 (H) 07/31/2021 1507   TRIG 259 (H) 07/31/2021 1507   HDL 45 07/31/2021 1507   CHOLHDL 5.0 (H) 07/31/2021 1507   LDLCALC 135 (H) 07/31/2021 1507    CBC    Component Value Date/Time   WBC 7.2 06/20/2021 1630   RBC 4.49 06/20/2021 1630   HGB 13.8 06/20/2021 1630   HCT 40.6 06/20/2021 1630   PLT 283 06/20/2021 1630   MCV 90 06/20/2021 1630   MCH 30.7 06/20/2021 1630   MCHC 34.0 06/20/2021 1630   RDW 11.4 (L) 06/20/2021 1630   LYMPHSABS 2.6 06/20/2021 1630   EOSABS 0.1 06/20/2021 1630   BASOSABS 0.0 06/20/2021 1630    Hgb A1C Lab Results  Component Value Date   HGBA1C 5.5 10/04/2019           Assessment & Plan:      RTC in 6 months for annual exam Webb Silversmith, NP

## 2022-11-20 NOTE — Patient Instructions (Signed)

## 2022-11-20 NOTE — Assessment & Plan Note (Signed)
Controlled and increased dose of amlodipine Reinforced DASH diet and exercise for weight loss

## 2022-11-20 NOTE — Assessment & Plan Note (Signed)
Encourage diet and exercise for weight loss 

## 2022-12-02 ENCOUNTER — Encounter: Payer: Self-pay | Admitting: Internal Medicine

## 2022-12-22 ENCOUNTER — Other Ambulatory Visit: Payer: Self-pay

## 2022-12-22 DIAGNOSIS — Z3041 Encounter for surveillance of contraceptive pills: Secondary | ICD-10-CM

## 2022-12-22 MED ORDER — NORETHINDRONE 0.35 MG PO TABS: 1.0000 | ORAL_TABLET | Freq: Every day | ORAL | 3 refills | Status: DC

## 2023-01-11 ENCOUNTER — Other Ambulatory Visit: Payer: Self-pay | Admitting: Obstetrics and Gynecology

## 2023-01-11 DIAGNOSIS — Z1231 Encounter for screening mammogram for malignant neoplasm of breast: Secondary | ICD-10-CM

## 2023-01-20 NOTE — Progress Notes (Deleted)
PCP:  Jearld Fenton, NP   No chief complaint on file.    HPI:      [name] is a 52 y.o. [phone] whose LMP was No LMP recorded., presents today for her annual examination.  Her menses are Q3 months with OCPs, lasting 3-4 days, rarely with BTB. Dysmenorrhea mild.   Sex activity: sexually active--contraception OCPs. Diagnosed with HTN this yr and glaucoma, doing tx.  Last Pap: 10/28/20 Results were: no abnormalities /neg HPV DNA  Hx of STDs: none  Last mammogram: 01/07/22 Results were: cat 4 RT breast--routine, had neg f/u 8/23. Has mammo 2/24 There is a FH of breast cancer in her mom and pat aunt. There is a FH of ovarian cancer in her pat aunt, and colon cancer in her pat uncle. The patient does do self-breast exams. BRCA neg 6/12; neg MyRisk update testing 2/20. Riskscore=34.5%/IBIS=21%. Taking Vit D supp. Pt aware of scr breast MRI option in conjunction with yearly mammo, but not done.  Tobacco use: The patient denies current or previous tobacco use. Alcohol use: none No drug use.  Exercise: not active  Colonoscopy: 1/23 at [place] with polyps, repeat after 7 yrs  She does get adequate calcium and Vitamin D in her diet.  Labs with PCP/work screening.   Past Medical History:  Diagnosis Date   Asthma    BRCA negative 2012, 2/19   BRCA neg 2012; MyRIsk neg 2019   Depression    Family history of breast cancer    Family history of ovarian cancer    6/12 BRCA Neg   Glaucoma    Hypertension    Increased risk of breast cancer 01/2018   IBIS=21%/riskscore=34.5%   Plantar fasciitis     Past Surgical History:  Procedure Laterality Date   BREAST BIOPSY Right 01/21/2022   [place] bx, heart marker, path pending   COLONOSCOPY WITH PROPOFOL N/A 01/02/2022   Procedure: COLONOSCOPY WITH PROPOFOL;  Surgeon: [name], MD;  Location: [facility];  Service: Endoscopy;  Laterality: N/A;   LYMPH NODE BIOPSY     POLYPECTOMY N/A 01/02/2022   Procedure:  POLYPECTOMY;  Surgeon: [name], MD;  Location: [facility];  Service: Endoscopy;  Laterality: N/A;    Family History  Problem Relation Age of Onset   Breast cancer Mother 73   Asthma Mother    Diabetes Mother    Heart attack Mother    Bladder Cancer Father    Hyperlipidemia Father    Hypertension Father    Asthma Brother    Depression Brother    Hypertension Brother    Learning disabilities Brother    Mental illness Brother    Alzheimer's disease Maternal Grandmother    Stomach cancer Paternal Grandmother    Breast cancer Paternal Aunt 23   Ovarian cancer Paternal Aunt 87   Colon cancer Paternal Uncle 64    Social History   Socioeconomic History   Marital status: Married    Spouse name: Not on file   Number of children: Not on file   Years of education: Not on file   Highest education level: Not on file  Occupational History   Not on file  Tobacco Use   Smoking status: Never   Smokeless tobacco: Never  Vaping Use   Vaping Use: Never used  Substance and Sexual Activity   Alcohol use: No   Drug use: No   Sexual activity: Yes    Birth control/protection: Pill  Other  Topics Concern   Not on file  Social History Narrative   Works at [employer], Conservation officer, historic buildings.     Social Determinants of Health   Financial Resource Strain: Not on file  Food Insecurity: Not on file  Transportation Needs: Not on file  Physical Activity: Not on file  Stress: Not on file  Social Connections: Not on file  Intimate Partner Violence: Not on file    No outpatient medications have been marked as taking for the 01/21/23 encounter (Appointment) with Cherylene Ferrufino, Deirdre Evener, PA-C.     ROS:  Review of Systems  Constitutional:  Negative for fatigue, fever and unexpected weight change.  Respiratory:  Negative for cough, shortness of breath and wheezing.   Cardiovascular:  Negative for chest pain, palpitations and leg swelling.  Gastrointestinal:  Negative for blood in stool,  constipation, diarrhea, nausea and vomiting.  Endocrine: Negative for cold intolerance, heat intolerance and polyuria.  Genitourinary:  Negative for dyspareunia, dysuria, flank pain, frequency, genital sores, hematuria, menstrual problem, pelvic pain, urgency, vaginal bleeding, vaginal discharge and vaginal pain.  Musculoskeletal:  Negative for back pain, joint swelling and myalgias.  Skin:  Negative for rash.  Neurological:  Negative for dizziness, syncope, light-headedness, numbness and headaches.  Hematological:  Negative for adenopathy.  Psychiatric/Behavioral:  Negative for agitation, confusion, sleep disturbance and suicidal ideas. The patient is not nervous/anxious.      Objective: There were no vitals taken for this visit.   Physical Exam Constitutional:      Appearance: She is well-developed.  Genitourinary:     Vulva normal.     Right Labia: No rash, tenderness or lesions.    Left Labia: No tenderness, lesions or rash.    No vaginal discharge, erythema or tenderness.      Right Adnexa: not tender and no mass present.    Left Adnexa: not tender and no mass present.    No cervical motion tenderness, friability or polyp.     Uterus is not enlarged or tender.  Breasts:    Right: No mass, nipple discharge, skin change or tenderness.     Left: No mass, nipple discharge, skin change or tenderness.  Neck:     Thyroid: No thyromegaly.  Cardiovascular:     Rate and Rhythm: Normal rate and regular rhythm.     Heart sounds: Normal heart sounds. No murmur heard. Pulmonary:     Effort: Pulmonary effort is normal.     Breath sounds: Normal breath sounds.  Abdominal:     Palpations: Abdomen is soft.     Tenderness: There is no abdominal tenderness. There is no guarding or rebound.  Musculoskeletal:        General: Normal range of motion.     Cervical back: Normal range of motion.  Lymphadenopathy:     Cervical: No cervical adenopathy.  Neurological:     General: No focal  deficit present.     Mental Status: She is alert and oriented to person, place, and time.     Cranial Nerves: No cranial nerve deficit.  Skin:    General: Skin is warm and dry.  Psychiatric:        Mood and Affect: Mood normal.        Behavior: Behavior normal.        Thought Content: Thought content normal.        Judgment: Judgment normal.  Vitals reviewed.     Assessment/Plan: Encounter for annual routine gynecological examination  Encounter for surveillance of  contraceptive pills - Plan: norethindrone (MICRONOR) 0.35 MG tablet, change to POPs due to HTN dx. Rx eRxd to [facility]. F/u prn.   Encounter for screening mammogram for malignant neoplasm of breast - Plan: MM 3D SCREEN BREAST BILATERAL; pt to [facility]  Family history of breast cancer--Pt is MyRisk neg  Increased risk of breast cancer--pt aware of monthly SBE, yearly CBE and mammos, as well as scr breast MRI. Will call for MRI ref spring 2023 if desires. Cont Vit D supp.   Screening for colon cancer - Plan: Ambulatory referral to Gastroenterology   Meds ordered this encounter  Medications                            norethindrone (MICRONOR) 0.35 MG tablet    Sig: Take 1 tablet (0.35 mg total) by mouth daily.    Dispense:  84 tablet    Refill:  3    Order Specific Question:   Supervising Provider    Answer:   Gae Dry [id number]             GYN counsel breast self exam, mammography screening, menopause, adequate intake of calcium and vitamin D, diet and exercise     F/U  No follow-ups on file.  Jaquitta Dupriest B. Channing Savich, PA-C 01/20/2023 9:04 PM

## 2023-01-21 ENCOUNTER — Ambulatory Visit: Payer: Managed Care, Other (non HMO) | Admitting: Obstetrics and Gynecology

## 2023-01-21 DIAGNOSIS — Z01419 Encounter for gynecological examination (general) (routine) without abnormal findings: Secondary | ICD-10-CM

## 2023-01-21 DIAGNOSIS — Z3041 Encounter for surveillance of contraceptive pills: Secondary | ICD-10-CM

## 2023-01-21 DIAGNOSIS — Z803 Family history of malignant neoplasm of breast: Secondary | ICD-10-CM

## 2023-01-21 DIAGNOSIS — Z9189 Other specified personal risk factors, not elsewhere classified: Secondary | ICD-10-CM

## 2023-01-21 DIAGNOSIS — Z1231 Encounter for screening mammogram for malignant neoplasm of breast: Secondary | ICD-10-CM

## 2023-01-28 ENCOUNTER — Ambulatory Visit
Admission: RE | Admit: 2023-01-28 | Discharge: 2023-01-28 | Disposition: A | Discharge status: Home / Self Care | Payer: Managed Care, Other (non HMO) | Source: Ambulatory Visit | Attending: Obstetrics and Gynecology | Admitting: Obstetrics and Gynecology

## 2023-01-28 DIAGNOSIS — Z1231 Encounter for screening mammogram for malignant neoplasm of breast: Secondary | ICD-10-CM | POA: Insufficient documentation

## 2023-02-28 NOTE — Progress Notes (Deleted)
PCP:  Jearld Fenton, NP   No chief complaint on file.    HPI:      [name] is a 52 y.o. [phone] whose LMP was No LMP recorded., presents today for her annual examination.  Her menses are Q3 months with OCPs, lasting 3-4 days, rarely with BTB. Dysmenorrhea mild.   Sex activity: sexually active--contraception POPs. Diagnosed with HTN this yr and glaucoma, doing tx.  Last Pap: 10/28/20 Results were: no abnormalities /neg HPV DNA  Hx of STDs: none  Last mammogram: 01/28/23 Results were: normal--routine follow-up in 12 months.  There is a FH of breast cancer in her mom and pat aunt. There is a FH of ovarian cancer in her pat aunt, and colon cancer in her pat uncle. The patient does do self-breast exams. BRCA neg 6/12; neg MyRisk update testing 2/20. Riskscore=34.5%/IBIS=21%. Taking Vit D supp. Pt aware of scr breast MRI option in conjunction with yearly mammo, but not done.  Tobacco use: The patient denies current or previous tobacco use. Alcohol use: none No drug use.  Exercise: not active  Colonoscopy: 1/23 with polyps with Dr. Allen Norris; repeat due after 7 yrs  She does get adequate calcium and Vitamin D in her diet.  Labs with PCP/work screening.   Past Medical History:  Diagnosis Date   Asthma    BRCA negative 2012, 2/19   BRCA neg 2012; MyRIsk neg 2019   Depression    Family history of breast cancer    Family history of ovarian cancer    6/12 BRCA Neg   Glaucoma    Hypertension    Increased risk of breast cancer 01/2018   IBIS=21%/riskscore=34.5%   Plantar fasciitis     Past Surgical History:  Procedure Laterality Date   BREAST BIOPSY Right 01/21/2022   [place] bx, heart marker, path pending   COLONOSCOPY WITH PROPOFOL N/A 01/02/2022   Procedure: COLONOSCOPY WITH PROPOFOL;  Surgeon: [name], MD;  Location: [facility];  Service: Endoscopy;  Laterality: N/A;   LYMPH NODE BIOPSY     POLYPECTOMY N/A 01/02/2022   Procedure: POLYPECTOMY;   Surgeon: [name], MD;  Location: [facility];  Service: Endoscopy;  Laterality: N/A;    Family History  Problem Relation Age of Onset   Breast cancer Mother 82   Asthma Mother    Diabetes Mother    Heart attack Mother    Bladder Cancer Father    Hyperlipidemia Father    Hypertension Father    Asthma Brother    Depression Brother    Hypertension Brother    Learning disabilities Brother    Mental illness Brother    Alzheimer's disease Maternal Grandmother    Stomach cancer Paternal Grandmother    Breast cancer Paternal Aunt 25   Ovarian cancer Paternal Aunt 29   Colon cancer Paternal Uncle 67    Social History   Socioeconomic History   Marital status: Married    Spouse name: Not on file   Number of children: Not on file   Years of education: Not on file   Highest education level: Not on file  Occupational History   Not on file  Tobacco Use   Smoking status: Never   Smokeless tobacco: Never  Vaping Use   Vaping Use: Never used  Substance and Sexual Activity   Alcohol use: No   Drug use: No   Sexual activity: Yes    Birth control/protection: Pill  Other Topics Concern  Not on file  Social History Narrative   Works at [employer], Conservation officer, historic buildings.     Social Determinants of Health   Financial Resource Strain: Not on file  Food Insecurity: Not on file  Transportation Needs: Not on file  Physical Activity: Not on file  Stress: Not on file  Social Connections: Not on file  Intimate Partner Violence: Not on file    No outpatient medications have been marked as taking for the 03/01/23 encounter (Appointment) with Aveline Daus, Deirdre Evener, PA-C.     ROS:  Review of Systems  Constitutional:  Negative for fatigue, fever and unexpected weight change.  Respiratory:  Negative for cough, shortness of breath and wheezing.   Cardiovascular:  Negative for chest pain, palpitations and leg swelling.  Gastrointestinal:  Negative for blood in stool, constipation,  diarrhea, nausea and vomiting.  Endocrine: Negative for cold intolerance, heat intolerance and polyuria.  Genitourinary:  Negative for dyspareunia, dysuria, flank pain, frequency, genital sores, hematuria, menstrual problem, pelvic pain, urgency, vaginal bleeding, vaginal discharge and vaginal pain.  Musculoskeletal:  Negative for back pain, joint swelling and myalgias.  Skin:  Negative for rash.  Neurological:  Negative for dizziness, syncope, light-headedness, numbness and headaches.  Hematological:  Negative for adenopathy.  Psychiatric/Behavioral:  Negative for agitation, confusion, sleep disturbance and suicidal ideas. The patient is not nervous/anxious.      Objective: There were no vitals taken for this visit.   Physical Exam Constitutional:      Appearance: She is well-developed.  Genitourinary:     Vulva normal.     Right Labia: No rash, tenderness or lesions.    Left Labia: No tenderness, lesions or rash.    No vaginal discharge, erythema or tenderness.      Right Adnexa: not tender and no mass present.    Left Adnexa: not tender and no mass present.    No cervical motion tenderness, friability or polyp.     Uterus is not enlarged or tender.  Breasts:    Right: No mass, nipple discharge, skin change or tenderness.     Left: No mass, nipple discharge, skin change or tenderness.  Neck:     Thyroid: No thyromegaly.  Cardiovascular:     Rate and Rhythm: Normal rate and regular rhythm.     Heart sounds: Normal heart sounds. No murmur heard. Pulmonary:     Effort: Pulmonary effort is normal.     Breath sounds: Normal breath sounds.  Abdominal:     Palpations: Abdomen is soft.     Tenderness: There is no abdominal tenderness. There is no guarding or rebound.  Musculoskeletal:        General: Normal range of motion.     Cervical back: Normal range of motion.  Lymphadenopathy:     Cervical: No cervical adenopathy.  Neurological:     General: No focal deficit present.      Mental Status: She is alert and oriented to person, place, and time.     Cranial Nerves: No cranial nerve deficit.  Skin:    General: Skin is warm and dry.  Psychiatric:        Mood and Affect: Mood normal.        Behavior: Behavior normal.        Thought Content: Thought content normal.        Judgment: Judgment normal.  Vitals reviewed.     Assessment/Plan: Encounter for annual routine gynecological examination  Encounter for surveillance of contraceptive pills - Plan:  norethindrone (MICRONOR) 0.35 MG tablet, change to POPs due to HTN dx. Rx eRxd to [facility]. F/u prn.   Encounter for screening mammogram for malignant neoplasm of breast - Plan: MM 3D SCREEN BREAST BILATERAL; pt to [facility]  Family history of breast cancer--Pt is MyRisk neg  Increased risk of breast cancer--pt aware of monthly SBE, yearly CBE and mammos, as well as scr breast MRI. Will call for MRI ref spring 2023 if desires. Cont Vit D supp.   Screening for colon cancer - Plan: Ambulatory referral to Gastroenterology   Meds ordered this encounter  Medications                            norethindrone (MICRONOR) 0.35 MG tablet    Sig: Take 1 tablet (0.35 mg total) by mouth daily.    Dispense:  84 tablet    Refill:  3    Order Specific Question:   Supervising Provider    Answer:   Gae Dry [id number]             GYN counsel breast self exam, mammography screening, menopause, adequate intake of calcium and vitamin D, diet and exercise     F/U  No follow-ups on file.  Herta Hink B. Toby Ayad, PA-C 02/28/2023 6:16 PM

## 2023-03-01 ENCOUNTER — Ambulatory Visit: Payer: Managed Care, Other (non HMO) | Admitting: Obstetrics and Gynecology

## 2023-03-01 DIAGNOSIS — Z01419 Encounter for gynecological examination (general) (routine) without abnormal findings: Secondary | ICD-10-CM

## 2023-03-01 DIAGNOSIS — Z1231 Encounter for screening mammogram for malignant neoplasm of breast: Secondary | ICD-10-CM

## 2023-03-01 DIAGNOSIS — Z3041 Encounter for surveillance of contraceptive pills: Secondary | ICD-10-CM

## 2023-03-01 DIAGNOSIS — Z9189 Other specified personal risk factors, not elsewhere classified: Secondary | ICD-10-CM

## 2023-03-01 DIAGNOSIS — Z803 Family history of malignant neoplasm of breast: Secondary | ICD-10-CM

## 2023-03-03 ENCOUNTER — Encounter: Payer: Self-pay | Admitting: Internal Medicine

## 2023-03-04 MED ORDER — OLMESARTAN MEDOXOMIL 20 MG PO TABS: 20.0000 mg | ORAL_TABLET | Freq: Every day | ORAL | 0 refills | Status: DC

## 2023-03-20 ENCOUNTER — Other Ambulatory Visit: Payer: Self-pay | Admitting: Internal Medicine

## 2023-03-22 NOTE — Telephone Encounter (Signed)
Unable to refill per protocol, Rx expired. Discontinued 03/04/23.  Requested Prescriptions  Pending Prescriptions Disp Refills   amLODipine (NORVASC) 10 MG tablet [Pharmacy Med Name: amLODIPine Besylate 10 MG Oral Tablet] 90 tablet 3    Sig: TAKE 1 TABLET BY MOUTH DAILY     Cardiovascular: Calcium Channel Blockers 2 Passed - 03/20/2023 10:50 PM      Passed - Last BP in normal range    BP Readings from Last 1 Encounters:  11/20/22 134/82         Passed - Last Heart Rate in normal range    Pulse Readings from Last 1 Encounters:  11/20/22 78         Passed - Valid encounter within last 6 months    Recent Outpatient Visits           4 months ago Need for influenza vaccination   [facility] [place], Coralie Keens, NP   4 months ago Primary hypertension   [facility] [place], Coralie Keens, NP   1 year ago Hypertension, unspecified type   [facility] [place], [place], MD       Future Appointments             In 1 week [name], Coralie Keens, NP [facility], [place]   In 2 months [place], Coralie Keens, NP [facility], [facility]

## 2023-03-23 ENCOUNTER — Other Ambulatory Visit: Payer: Self-pay | Admitting: Family Medicine

## 2023-03-23 DIAGNOSIS — I10 Essential (primary) hypertension: Secondary | ICD-10-CM

## 2023-03-23 MED ORDER — OLMESARTAN MEDOXOMIL 20 MG PO TABS: 20.0000 mg | ORAL_TABLET | Freq: Every day | ORAL | 0 refills | Status: DC

## 2023-03-29 ENCOUNTER — Ambulatory Visit: Payer: Managed Care, Other (non HMO) | Admitting: Internal Medicine

## 2023-03-29 ENCOUNTER — Encounter: Payer: Self-pay | Admitting: Internal Medicine

## 2023-03-29 VITALS — BP 112/68 | HR 69 | Temp 96.8°F | Wt 178.0 lb

## 2023-03-29 DIAGNOSIS — E663 Overweight: Secondary | ICD-10-CM

## 2023-03-29 DIAGNOSIS — I10 Essential (primary) hypertension: Secondary | ICD-10-CM | POA: Diagnosis not present

## 2023-03-29 DIAGNOSIS — R7989 Other specified abnormal findings of blood chemistry: Secondary | ICD-10-CM

## 2023-03-29 DIAGNOSIS — Z6828 Body mass index (BMI) 28.0-28.9, adult: Secondary | ICD-10-CM

## 2023-03-29 DIAGNOSIS — R002 Palpitations: Secondary | ICD-10-CM | POA: Diagnosis not present

## 2023-03-29 NOTE — Assessment & Plan Note (Signed)
Controlled on olmesartan Reinforced DASH diet and exercise for weight loss 

## 2023-03-29 NOTE — Patient Instructions (Signed)
Palpitations Palpitations are feelings that your heartbeat is not normal. Your heartbeat may feel like it is: Uneven (irregular). Faster than normal. Fluttering. Skipping a beat. This is usually not a serious problem. However, a doctor will do tests and check your medical history to make sure that you do not have a serious heart problem. Follow these instructions at home: Watch for any changes in your condition. Tell your doctor about any changes. Take these actions to help manage your symptoms: Eating and drinking Follow instructions from your doctor about things to eat and drink. You may be told to avoid these things: Drinks that have caffeine in them, such as coffee, tea, soft drinks, and energy drinks. Chocolate. Alcohol. Diet pills. Lifestyle     Try to lower your stress. These things can help you relax: Yoga. Deep breathing and meditation. Guided imagery. This is using words and images to create positive thoughts. Exercise, including swimming, jogging, and walking. Tell your doctor if you have more abnormal heartbeats when you are active. If you have chest pain or feel short of breath with exercise, do not keep doing the exercise until you are seen by your doctor. Biofeedback. This is using your mind to control things in your body, such as your heartbeat. Get plenty of rest and sleep. Keep a regular bed time. Do not use drugs, such as cocaine or ecstasy. Do not use marijuana. Do not smoke or use any products that contain nicotine or tobacco. If you need help quitting, ask your doctor. General instructions Take over-the-counter and prescription medicines only as told by your doctor. Keep all follow-up visits. You may need more tests if palpitations do not go away or get worse. Contact a doctor if: You keep having fast or uneven heartbeats for a long time. Your symptoms happen more often. Get help right away if: You have chest pain. You feel short of breath. You have a very  bad headache. You feel dizzy. You faint. These symptoms may be an emergency. Get help right away. Call your local emergency services (911 in the [place] Do not wait to see if the symptoms will go away. Do not drive yourself to the hospital. Summary Palpitations are feelings that your heartbeat is uneven or faster than normal. It may feel like your heart is fluttering or skipping a beat. Avoid food and drinks that may cause this condition. These include caffeine, chocolate, and alcohol. Try to lower your stress. Do not smoke or use drugs. Get help right away if you faint, feel dizzy, feel short of breath, have chest pain, or have a very bad headache. This information is not intended to replace advice given to you by your health care provider. Make sure you discuss any questions you have with your health care provider. Document Revised: 04/30/2021 Document Reviewed: 04/30/2021 Elsevier Patient Education  2023 [employer]  

## 2023-03-29 NOTE — Assessment & Plan Note (Addendum)
Encourage diet and exercise for weight loss 

## 2023-03-29 NOTE — Progress Notes (Signed)
Subjective:    Patient ID: Nicole Harrell, female    DOB: 1970/12/25, 52 y.o.   MRN: [id number]  HPI  Patient presents to clinic today for 2-week follow-up of HTN.  She was changed from Amlodipine to Olmesartan due to concern for swelling.  She has been taking the medication as prescribed but does reports she has been having palpitations the last 2 days.  She denies chest pain, chest tightness or shortness of breath.  Her BP today is 112/68.  There is no ECG on file.  Review of Systems     Past Medical History:  Diagnosis Date   Asthma    BRCA negative 2012, 2/19   BRCA neg 2012; MyRIsk neg 2019   Depression    Family history of breast cancer    Family history of ovarian cancer    6/12 BRCA Neg   Glaucoma    Hypertension    Increased risk of breast cancer 01/2018   IBIS=21%/riskscore=34.5%   Plantar fasciitis     Current Outpatient Medications  Medication Sig Dispense Refill   ADVAIR DISKUS 250-50 MCG/ACT AEPB Inhale 1 puff into the lungs 2 (two) times daily.     albuterol (VENTOLIN HFA) 108 (90 Base) MCG/ACT inhaler TAKE 2 PUFFS BY MOUTH EVERY 6 HOURS AS NEEDED FOR WHEEZE OR SHORTNESS OF BREATH 8.5 each 1   cetirizine (ZYRTEC) 10 MG tablet Take 1 tablet by mouth daily.     Cholecalciferol (VITAMIN D-3) 5000 units TABS Take by mouth.     montelukast (SINGULAIR) 10 MG tablet Take 10 mg by mouth at bedtime.     norethindrone (MICRONOR) 0.35 MG tablet Take 1 tablet (0.35 mg total) by mouth daily. 84 tablet 3   olmesartan (BENICAR) 20 MG tablet Take 1 tablet (20 mg total) by mouth daily. 90 tablet 0   SIMBRINZA 1-0.2 % SUSP Apply 1 drop to eye 2 (two) times daily.     No current facility-administered medications for this visit.    Allergies  Allergen Reactions   Iodinated Contrast Media Hives   Other Hives    Seasonal, cats, dogs   Penicillins Hives    Family History  Problem Relation Age of Onset   Breast cancer Mother 83   Asthma Mother    Diabetes Mother     Heart attack Mother    Bladder Cancer Father    Hyperlipidemia Father    Hypertension Father    Asthma Brother    Depression Brother    Hypertension Brother    Learning disabilities Brother    Mental illness Brother    Alzheimer's disease Maternal Grandmother    Stomach cancer Paternal Grandmother    Breast cancer Paternal Aunt 67   Ovarian cancer Paternal Aunt 60   Colon cancer Paternal Uncle 56    Social History   Socioeconomic History   Marital status: Married    Spouse name: Not on file   Number of children: Not on file   Years of education: Not on file   Highest education level: Associate degree: occupational, Scientist, product/process development, or vocational program  Occupational History   Not on file  Tobacco Use   Smoking status: Never   Smokeless tobacco: Never  Vaping Use   Vaping Use: Never used  Substance and Sexual Activity   Alcohol use: No   Drug use: No   Sexual activity: Yes    Birth control/protection: Pill  Other Topics Concern   Not on file  Social History Narrative  Works at [employer], Teacher, 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     Social Determinants of Health   Financial Resource Strain: Low Risk  (03/26/2023)   Overall Financial Resource Strain (CARDIA)    Difficulty of Paying Living Expenses: Not hard at all  Food Insecurity: No Food Insecurity (03/26/2023)   Hunger Vital Sign    Worried About Running Out of Food in the Last Year: Never true    Ran Out of Food in the Last Year: Never true  Transportation Needs: No Transportation Needs (03/26/2023)   PRAPARE - Administrator, Civil ServiceTransportation    Lack of Transportation (Medical): No    Lack of Transportation (Non-Medical): No  Physical Activity: Sufficiently Active (03/26/2023)   Exercise Vital Sign    Days of Exercise per Week: 3 days    Minutes of Exercise per Session: 60 min  Stress: No Stress Concern Present (03/26/2023)   [employer] - Occupational Stress Questionnaire    Feeling of Stress : Only a little  Social  Connections: Socially Integrated (03/26/2023)   Social Connection and Isolation Panel [NHANES]    Frequency of Communication with Friends and Family: Twice a week    Frequency of Social Gatherings with Friends and Family: Twice a week    Attends Religious Services: More than 4 times per year    Active Member of [employer] Financial[employer]ubs or Organizations: Yes    Attends Engineer, structuralClub or Organization Meetings: More than 4 times per year    Marital Status: Married  Catering managerntimate Partner Violence: Not on file     Constitutional: Denies fever, malaise, fatigue, headache or abrupt weight changes.  Respiratory: Denies difficulty breathing, shortness of breath, cough or sputum production.   Cardiovascular: Patient reports palpitations.  Denies chest pain, chest tightness, or swelling in the hands or feet.  Gastrointestinal: Denies abdominal pain, bloating, constipation, diarrhea or blood in the stool.  Neurological: Denies dizziness, difficulty with memory, difficulty with speech or problems with balance and coordination.  Psych: Denies anxiety, depression, SI/HI.  No other specific complaints in a complete review of systems (except as listed in HPI above).  Objective:   Physical Exam  BP 112/68 (BP Location: Left Arm, Patient Position: Sitting, Cuff Size: Normal)   Pulse 69   Temp (!) 96.8 F (36 C) (Temporal)   Wt 178 lb (80.7 kg)   SpO2 98%   BMI 28.73 kg/m   Wt Readings from Last 3 Encounters:  11/20/22 180 lb (81.6 kg)  11/06/22 182 lb (82.6 kg)  03/19/22 182 lb (82.6 kg)    General: Appears her stated age, overweight in NAD. Skin: Warm, dry and intact. No rashes, lesions or ulcerations noted. HEENT: Head: normal shape and size; Eyes: sclera white, PERRLA and EOMs intact;  Cardiovascular: Normal rate and rhythm. S1,S2 noted.  No murmur, rubs or gallops noted. No JVD or BLE edema.  Pulmonary/Chest: Normal effort and positive vesicular breath sounds. No respiratory distress. No wheezes, rales or ronchi noted.   Musculoskeletal: No difficulty with gait.  Neurological: Alert and oriented.  Coordination normal.     BMET    Component Value Date/Time   NA 135 06/20/2021 1630   K 3.9 06/20/2021 1630   CL 102 06/20/2021 1630   CO2 19 (L) 06/20/2021 1630   GLUCOSE 88 06/20/2021 1630   BUN 14 06/20/2021 1630   CREATININE 0.6 07/21/2022 0000   CREATININE 0.71 06/20/2021 1630   CALCIUM 9.5 06/20/2021 1630    Lipid Panel     Component Value Date/Time   CHOL  180 07/21/2022 0000   CHOL 226 (H) 07/31/2021 1507   TRIG 106 07/21/2022 0000   HDL 51 07/21/2022 0000   HDL 45 07/31/2021 1507   CHOLHDL 5.0 (H) 07/31/2021 1507   LDLCALC 110 07/21/2022 0000   LDLCALC 135 (H) 07/31/2021 1507    CBC    Component Value Date/Time   WBC 7.2 06/20/2021 1630   RBC 4.49 06/20/2021 1630   HGB 13.8 06/20/2021 1630   HCT 40.6 06/20/2021 1630   PLT 283 06/20/2021 1630   MCV 90 06/20/2021 1630   MCH 30.7 06/20/2021 1630   MCHC 34.0 06/20/2021 1630   RDW 11.4 (L) 06/20/2021 1630   LYMPHSABS 2.6 06/20/2021 1630   EOSABS 0.1 06/20/2021 1630   BASOSABS 0.0 06/20/2021 1630    Hgb A1C Lab Results  Component Value Date   HGBA1C 5.7 07/21/2022           Assessment & Plan:   Palpitations:  Indication for ECG: Palpitations Interpretation of ECG: Normal rate and rhythm, no acute findings Comparison of ECG: None Will check TSH, BMET and magnesium If persists or worsens, could consider ordering a Holter Monitor  RTC in 2 months for annual exam Nicki Reaper, NP

## 2023-03-30 LAB — BASIC METABOLIC PANEL WITH GFR
BUN: 13 mg/dL (ref 7–25)
CO2: 24 mmol/L (ref 20–32)
Calcium: 9.6 mg/dL (ref 8.6–10.4)
Chloride: 107 mmol/L (ref 98–110)
Creat: 0.6 mg/dL (ref 0.50–1.03)
Glucose, Bld: 106 mg/dL — ABNORMAL HIGH (ref 65–99)
Potassium: 4.3 mmol/L (ref 3.5–5.3)
Sodium: 138 mmol/L (ref 135–146)
eGFR: 109 mL/min/{1.73_m2} (ref >=60)

## 2023-03-30 LAB — TSH: TSH: 0.01 mIU/L — ABNORMAL LOW

## 2023-03-30 LAB — MAGNESIUM: Magnesium: 1.9 mg/dL (ref 1.5–2.5)

## 2023-03-30 NOTE — Addendum Note (Signed)
Addended by: Lorre Munroe on: 03/30/2023 07:52 AM   Modules accepted: Orders

## 2023-03-31 ENCOUNTER — Encounter: Payer: Self-pay | Admitting: Internal Medicine

## 2023-04-06 NOTE — Progress Notes (Unsigned)
PCP:  Lorre Munroe, NP   No chief complaint on file.    HPI:      [name] is a 52 y.o. [phone] whose LMP was No LMP recorded., presents today for her annual examination.  Her menses are Q3 months with OCPs, lasting 3-4 days, rarely with BTB. Dysmenorrhea mild.   Sex activity: sexually active--contraception POPs due to HTN.  Last Pap: 10/28/20 Results were: no abnormalities /neg HPV DNA  Hx of STDs: none  Last mammogram: 01/28/23 Results were: normal--routine follow-up in 12 months.  There is a FH of breast cancer in her mom and pat aunt. There is a FH of ovarian cancer in her pat aunt, and colon cancer in her pat uncle. The patient does do self-breast exams. BRCA neg 6/12; neg MyRisk update testing 2/20. Riskscore=34.5%/IBIS=21%. Taking Vit D supp. Pt aware of scr breast MRI option in conjunction with yearly mammo, but not done.  Tobacco use: The patient denies current or previous tobacco use. Alcohol use: none No drug use.  Exercise: not active  Colonoscopy: 1/23 with polyp with Dr. Servando Snare, repeat due after 7 yrs  She does get adequate calcium and Vitamin D in her diet.  Labs with PCP/work screening.   Past Medical History:  Diagnosis Date   Asthma    BRCA negative 2012, 2/19   BRCA neg 2012; MyRIsk neg 2019   Depression    Family history of breast cancer    Family history of ovarian cancer    6/12 BRCA Neg   Glaucoma    Hypertension    Increased risk of breast cancer 01/2018   IBIS=21%/riskscore=34.5%   Plantar fasciitis     Past Surgical History:  Procedure Laterality Date   BREAST BIOPSY Right 01/21/2022   [place] bx, heart marker, path pending   COLONOSCOPY WITH PROPOFOL N/A 01/02/2022   Procedure: COLONOSCOPY WITH PROPOFOL;  Surgeon: Midge Minium, MD;  [facility];  Service: Endoscopy;  Laterality: N/A;   LYMPH NODE BIOPSY     POLYPECTOMY N/A 01/02/2022   Procedure: POLYPECTOMY;  Surgeon: Midge Minium, MD;  Location: [facility];  Service: Endoscopy;  Laterality: N/A;    Family History  Problem Relation Age of Onset   Breast cancer Mother 10   Asthma Mother    Diabetes Mother    Heart attack Mother    Bladder Cancer Father    Hyperlipidemia Father    Hypertension Father    Asthma Brother    Depression Brother    Hypertension Brother    Learning disabilities Brother    Mental illness Brother    Alzheimer's disease Maternal Grandmother    Stomach cancer Paternal Grandmother    Breast cancer Paternal Aunt 30   Ovarian cancer Paternal Aunt 50   Colon cancer Paternal Uncle 33    Social History   Socioeconomic History   Marital status: Married    Spouse name: Not on file   Number of children: Not on file   Years of education: Not on file   Highest education level: Associate degree: occupational, Scientist, product/process development, or vocational program  Occupational History   Not on file  Tobacco Use   Smoking status: Never   Smokeless tobacco: Never  Vaping Use   Vaping Use: Never used  Substance and Sexual Activity   Alcohol use: No   Drug use: No   Sexual activity: Yes    Birth control/protection: Pill  Other Topics Concern   Not on  file  Social History Narrative   Works at [employer], Teacher, music.     Social Determinants of Health   Financial Resource Strain: Low Risk  (03/26/2023)   Overall Financial Resource Strain (CARDIA)    Difficulty of Paying Living Expenses: Not hard at all  Food Insecurity: No Food Insecurity (03/26/2023)   Hunger Vital Sign    Worried About Running Out of Food in the Last Year: Never true    Ran Out of Food in the Last Year: Never true  Transportation Needs: No Transportation Needs (03/26/2023)   PRAPARE - Administrator, Civil Service (Medical): No    Lack of Transportation (Non-Medical): No  Physical Activity: Sufficiently Active (03/26/2023)   Exercise Vital Sign    Days of Exercise per Week: 3 days    Minutes of Exercise per Session: 60 min  Stress: No  Stress Concern Present (03/26/2023)   [employer] of Occupational Health - Occupational Stress Questionnaire    Feeling of Stress : Only a little  Social Connections: Socially Integrated (03/26/2023)   Social Connection and Isolation Panel [NHANES]    Frequency of Communication with Friends and Family: Twice a week    Frequency of Social Gatherings with Friends and Family: Twice a week    Attends Religious Services: More than 4 times per year    Active Member of [employer] or Organizations: Yes    Attends Banker Meetings: More than 4 times per year    Marital Status: Married  Catering manager Violence: Not on file    No outpatient medications have been marked as taking for the 04/08/23 encounter (Appointment) with Buren Havey B, PA-C.     ROS:  Review of Systems  Constitutional:  Negative for fatigue, fever and unexpected weight change.  Respiratory:  Negative for cough, shortness of breath and wheezing.   Cardiovascular:  Negative for chest pain, palpitations and leg swelling.  Gastrointestinal:  Negative for blood in stool, constipation, diarrhea, nausea and vomiting.  Endocrine: Negative for cold intolerance, heat intolerance and polyuria.  Genitourinary:  Negative for dyspareunia, dysuria, flank pain, frequency, genital sores, hematuria, menstrual problem, pelvic pain, urgency, vaginal bleeding, vaginal discharge and vaginal pain.  Musculoskeletal:  Negative for back pain, joint swelling and myalgias.  Skin:  Negative for rash.  Neurological:  Negative for dizziness, syncope, light-headedness, numbness and headaches.  Hematological:  Negative for adenopathy.  Psychiatric/Behavioral:  Negative for agitation, confusion, sleep disturbance and suicidal ideas. The patient is not nervous/anxious.      Objective: There were no vitals taken for this visit.   Physical Exam Constitutional:      Appearance: She is well-developed.  Genitourinary:     Vulva normal.      Right Labia: No rash, tenderness or lesions.    Left Labia: No tenderness, lesions or rash.    No vaginal discharge, erythema or tenderness.      Right Adnexa: not tender and no mass present.    Left Adnexa: not tender and no mass present.    No cervical motion tenderness, friability or polyp.     Uterus is not enlarged or tender.  Breasts:    Right: No mass, nipple discharge, skin change or tenderness.     Left: No mass, nipple discharge, skin change or tenderness.  Neck:     Thyroid: No thyromegaly.  Cardiovascular:     Rate and Rhythm: Normal rate and regular rhythm.     Heart sounds: Normal heart sounds.  No murmur heard. Pulmonary:     Effort: Pulmonary effort is normal.     Breath sounds: Normal breath sounds.  Abdominal:     Palpations: Abdomen is soft.     Tenderness: There is no abdominal tenderness. There is no guarding or rebound.  Musculoskeletal:        General: Normal range of motion.     Cervical back: Normal range of motion.  Lymphadenopathy:     Cervical: No cervical adenopathy.  Neurological:     General: No focal deficit present.     Mental Status: She is alert and oriented to person, place, and time.     Cranial Nerves: No cranial nerve deficit.  Skin:    General: Skin is warm and dry.  Psychiatric:        Mood and Affect: Mood normal.        Behavior: Behavior normal.        Thought Content: Thought content normal.        Judgment: Judgment normal.  Vitals reviewed.     Assessment/Plan: Encounter for annual routine gynecological examination  Encounter for surveillance of contraceptive pills - Plan: norethindrone (MICRONOR) 0.35 MG tablet, change to POPs due to HTN dx. Rx eRxd to [facility]. F/u prn.   Encounter for screening mammogram for malignant neoplasm of breast - Plan: MM 3D SCREEN BREAST BILATERAL; pt [facility]  Family history of breast cancer--Pt is MyRisk neg  Increased risk of breast cancer--pt aware of monthly SBE, yearly CBE and  mammos, as well as scr breast MRI. Will call for MRI ref spring 2023 if desires. Cont Vit D supp.   Screening for colon cancer - Plan: Ambulatory referral to Gastroenterology   Meds ordered this encounter  Medications                            norethindrone (MICRONOR) 0.35 MG tablet    Sig: Take 1 tablet (0.35 mg total) by mouth daily.    Dispense:  84 tablet    Refill:  3    Order Specific Question:   Supervising Provider    Answer:   Nadara Mustard [[id number]]             GYN counsel breast self exam, mammography screening, menopause, adequate intake of calcium and vitamin D, diet and exercise     F/U  No follow-ups on file.  Meztli Llanas B. Ruhama Lehew, PA-C 04/06/2023 10:47 AM

## 2023-04-08 ENCOUNTER — Ambulatory Visit (INDEPENDENT_AMBULATORY_CARE_PROVIDER_SITE_OTHER): Payer: Managed Care, Other (non HMO) | Admitting: Obstetrics and Gynecology

## 2023-04-08 ENCOUNTER — Encounter: Payer: Self-pay | Admitting: Obstetrics and Gynecology

## 2023-04-08 VITALS — BP 110/60 | Ht 66.0 in | Wt 177.0 lb

## 2023-04-08 DIAGNOSIS — Z01419 Encounter for gynecological examination (general) (routine) without abnormal findings: Secondary | ICD-10-CM

## 2023-04-08 DIAGNOSIS — Z803 Family history of malignant neoplasm of breast: Secondary | ICD-10-CM

## 2023-04-08 DIAGNOSIS — Z1231 Encounter for screening mammogram for malignant neoplasm of breast: Secondary | ICD-10-CM

## 2023-04-08 DIAGNOSIS — Z3041 Encounter for surveillance of contraceptive pills: Secondary | ICD-10-CM

## 2023-04-08 DIAGNOSIS — Z1239 Encounter for other screening for malignant neoplasm of breast: Secondary | ICD-10-CM

## 2023-04-08 DIAGNOSIS — Z9189 Other specified personal risk factors, not elsewhere classified: Secondary | ICD-10-CM

## 2023-04-08 MED ORDER — NORETHINDRONE 0.35 MG PO TABS: 1.0000 | ORAL_TABLET | Freq: Every day | ORAL | 3 refills | Status: DC

## 2023-04-08 NOTE — Patient Instructions (Signed)
I value your feedback and you entrusting us with your care. If you get a [facility] patient survey, I would appreciate you taking the time to let us know about your experience today. Thank you! ? ? ?

## 2023-04-12 ENCOUNTER — Other Ambulatory Visit: Payer: Managed Care, Other (non HMO)

## 2023-04-12 DIAGNOSIS — R002 Palpitations: Secondary | ICD-10-CM

## 2023-04-12 DIAGNOSIS — R7989 Other specified abnormal findings of blood chemistry: Secondary | ICD-10-CM

## 2023-04-13 LAB — THYROGLOBULIN LEVEL: Thyroglobulin: 11.2 ng/mL

## 2023-04-13 LAB — T3: T3, Total: 173 ng/dL (ref 76–181)

## 2023-04-13 LAB — TSH: TSH: 0.01 mIU/L — ABNORMAL LOW

## 2023-04-13 LAB — THYROID PEROXIDASE ANTIBODIES (TPO) (REFL): Thyroperoxidase Ab SerPl-aCnc: 225 IU/mL — ABNORMAL HIGH (ref <9)

## 2023-04-13 LAB — T4, FREE: Free T4: 1.7 ng/dL (ref 0.8–1.8)

## 2023-04-14 ENCOUNTER — Encounter: Payer: Self-pay | Admitting: Internal Medicine

## 2023-04-14 DIAGNOSIS — R768 Other specified abnormal immunological findings in serum: Secondary | ICD-10-CM

## 2023-04-14 DIAGNOSIS — R7989 Other specified abnormal findings of blood chemistry: Secondary | ICD-10-CM

## 2023-05-01 ENCOUNTER — Telehealth: Payer: Managed Care, Other (non HMO) | Admitting: Nurse Practitioner

## 2023-05-01 DIAGNOSIS — R399 Unspecified symptoms and signs involving the genitourinary system: Secondary | ICD-10-CM | POA: Diagnosis not present

## 2023-05-01 MED ORDER — NITROFURANTOIN MONOHYD MACRO 100 MG PO CAPS: 100.0000 mg | ORAL_CAPSULE | Freq: Two times a day (BID) | ORAL | 0 refills | Status: AC

## 2023-05-01 NOTE — Progress Notes (Signed)
I have spent 5 minutes in review of e-visit questionnaire, review and updating patient chart, medical decision making and response to patient.  ° °Beckham Capistran W Lincoln Ginley, NP ° °  °

## 2023-05-01 NOTE — Progress Notes (Signed)

## 2023-05-12 ENCOUNTER — Other Ambulatory Visit: Payer: Self-pay | Admitting: Family Medicine

## 2023-05-12 DIAGNOSIS — I10 Essential (primary) hypertension: Secondary | ICD-10-CM

## 2023-05-13 NOTE — Telephone Encounter (Signed)
Requested Prescriptions  Refused Prescriptions Disp Refills   olmesartan (BENICAR) 20 MG tablet [Pharmacy Med Name: Olmesartan Medoxomil 20 MG Oral Tablet] 90 tablet 3    Sig: TAKE 1 TABLET BY MOUTH DAILY     Cardiovascular:  Angiotensin Receptor Blockers Passed - 05/12/2023 10:30 PM      Passed - Cr in normal range and within 180 days    Creat  Date Value Ref Range Status  03/29/2023 0.60 0.50 - 1.03 mg/dL Final         Passed - K in normal range and within 180 days    Potassium  Date Value Ref Range Status  03/29/2023 4.3 3.5 - 5.3 mmol/L Final         Passed - Patient is not pregnant      Passed - Last BP in normal range    BP Readings from Last 1 Encounters:  04/08/23 110/60         Passed - Valid encounter within last 6 months    Recent Outpatient Visits           1 month ago Primary hypertension   [place] [facility] [place], Salvadore Oxford, NP   5 months ago Need for influenza vaccination   [facility] [place], Salvadore Oxford, NP   6 months ago Primary hypertension   [place] [facility] [place], Salvadore Oxford, NP   1 year ago Hypertension, unspecified type   [facility], MD       Future Appointments             In 1 week [name], Salvadore Oxford, NP [facility], [facility]

## 2023-05-21 ENCOUNTER — Ambulatory Visit (INDEPENDENT_AMBULATORY_CARE_PROVIDER_SITE_OTHER): Payer: Managed Care, Other (non HMO) | Admitting: Internal Medicine

## 2023-05-21 ENCOUNTER — Encounter: Payer: Self-pay | Admitting: Internal Medicine

## 2023-05-21 VITALS — BP 124/72 | HR 77 | Temp 96.9°F | Ht 66.0 in | Wt 181.0 lb

## 2023-05-21 DIAGNOSIS — Z6829 Body mass index (BMI) 29.0-29.9, adult: Secondary | ICD-10-CM

## 2023-05-21 DIAGNOSIS — E663 Overweight: Secondary | ICD-10-CM

## 2023-05-21 DIAGNOSIS — Z0001 Encounter for general adult medical examination with abnormal findings: Secondary | ICD-10-CM | POA: Diagnosis not present

## 2023-05-21 DIAGNOSIS — R7303 Prediabetes: Secondary | ICD-10-CM | POA: Diagnosis not present

## 2023-05-21 DIAGNOSIS — E0590 Thyrotoxicosis, unspecified without thyrotoxic crisis or storm: Secondary | ICD-10-CM | POA: Insufficient documentation

## 2023-05-21 NOTE — Progress Notes (Signed)
Subjective:    Patient ID: Nicole Harrell, female    DOB: [birth year], 52 y.o.   MRN: [id number]  HPI  Patient presents to clinic today for her annual exam.  Flu: 09/2019 Tetanus: 12/2014 COVID: Never Shingrix: Never Pap smear: 10/2020 Mammogram: 01/2023 Colon screening: 12/2021 Vision screening: annually Dentist: biannually  Diet: She does eat meat. She consumes fruits and veggies. She does eat some fried foods. She drinks mostly water, black tea. Exercise: Trainer 2 x week, walking  Review of Systems     Past Medical History:  Diagnosis Date   Asthma    BRCA negative 2012, 2/19   BRCA neg 2012; MyRIsk neg 2019   Depression    Family history of breast cancer    Family history of ovarian cancer    6/12 BRCA Neg   Glaucoma    Hypertension    Increased risk of breast cancer 01/2018   IBIS=21%/riskscore=34.5%   Plantar fasciitis     Current Outpatient Medications  Medication Sig Dispense Refill   albuterol (VENTOLIN HFA) 108 (90 Base) MCG/ACT inhaler TAKE 2 PUFFS BY MOUTH EVERY 6 HOURS AS NEEDED FOR WHEEZE OR SHORTNESS OF BREATH 8.5 each 1   cetirizine (ZYRTEC) 10 MG tablet Take 1 tablet by mouth daily.     Cholecalciferol (VITAMIN D-3) 5000 units TABS Take by mouth.     fluticasone furoate-vilanterol (BREO ELLIPTA) 100-25 MCG/ACT AEPB 1 puff Inhalation Once a day for 30 days     montelukast (SINGULAIR) 10 MG tablet Take 10 mg by mouth at bedtime.     norethindrone (MICRONOR) 0.35 MG tablet Take 1 tablet (0.35 mg total) by mouth daily. 84 tablet 3   olmesartan (BENICAR) 20 MG tablet Take 1 tablet (20 mg total) by mouth daily. 90 tablet 0   SIMBRINZA 1-0.2 % SUSP Apply 1 drop to eye 2 (two) times daily.     No current facility-administered medications for this visit.    Allergies  Allergen Reactions   Iodinated Contrast Media Hives   Other Hives    Seasonal, cats, dogs   Penicillins Hives    Family History  Problem Relation Age of Onset   Breast cancer  Mother 39   Asthma Mother    Diabetes Mother    Heart attack Mother    Bladder Cancer Father    Hyperlipidemia Father    Hypertension Father    Asthma Brother    Depression Brother    Hypertension Brother    Learning disabilities Brother    Mental illness Brother    Alzheimer's disease Maternal Grandmother    Stomach cancer Paternal Grandmother    Breast cancer Paternal Aunt 49   Ovarian cancer Paternal Aunt 20   Colon cancer Paternal Uncle 71    Social History   Socioeconomic History   Marital status: Married    Spouse name: Not on file   Number of children: Not on file   Years of education: Not on file   Highest education level: Associate degree: occupational, Scientist, product/process development, or vocational program  Occupational History   Not on file  Tobacco Use   Smoking status: Never   Smokeless tobacco: Never  Vaping Use   Vaping Use: Never used  Substance and Sexual Activity   Alcohol use: No   Drug use: No   Sexual activity: Yes    Birth control/protection: Pill  Other Topics Concern   Not on file  Social History Narrative   Works at [employer], Teacher, music.  Social Determinants of Health   Financial Resource Strain: Low Risk  (03/26/2023)   Overall Financial Resource Strain (CARDIA)    Difficulty of Paying Living Expenses: Not hard at all  Food Insecurity: No Food Insecurity (03/26/2023)   Hunger Vital Sign    Worried About Running Out of Food in the Last Year: Never true    Ran Out of Food in the Last Year: Never true  Transportation Needs: No Transportation Needs (03/26/2023)   PRAPARE - Administrator, Civil Service (Medical): No    Lack of Transportation (Non-Medical): No  Physical Activity: Sufficiently Active (03/26/2023)   Exercise Vital Sign    Days of Exercise per Week: 3 days    Minutes of Exercise per Session: 60 min  Stress: No Stress Concern Present (03/26/2023)   [employer] of Occupational Health - Occupational Stress Questionnaire     Feeling of Stress : Only a little  Social Connections: Socially Integrated (03/26/2023)   Social Connection and Isolation Panel [NHANES]    Frequency of Communication with Friends and Family: Twice a week    Frequency of Social Gatherings with Friends and Family: Twice a week    Attends Religious Services: More than 4 times per year    Active Member of [employer] or Organizations: Yes    Attends Engineer, structural: More than 4 times per year    Marital Status: Married  Catering manager Violence: Not on file     Constitutional: Denies fever, malaise, fatigue, headache or abrupt weight changes.  HEENT: Patient reports ringing in the ears.  Denies eye pain, eye redness, ear pain, wax buildup, runny nose, nasal congestion, bloody nose, or sore throat. Respiratory: Denies difficulty breathing, shortness of breath, cough or sputum production.   Cardiovascular: Denies chest pain, chest tightness, palpitations or swelling in the hands or feet.  Gastrointestinal: Denies abdominal pain, bloating, constipation, diarrhea or blood in the stool.  GU: Denies urgency, frequency, pain with urination, burning sensation, blood in urine, odor or discharge. Musculoskeletal: Denies decrease in range of motion, difficulty with gait, muscle pain or joint pain and swelling.  Skin: Denies redness, rashes, lesions or ulcercations.  Neurological: Denies dizziness, difficulty with memory, difficulty with speech or problems with balance and coordination.  Psych: Denies anxiety, depression, SI/HI.  No other specific complaints in a complete review of systems (except as listed in HPI above).  Objective:   Physical Exam  BP 124/72 (BP Location: Left Arm, Patient Position: Sitting, Cuff Size: Normal)   Pulse 77   Temp (!) 96.9 F (36.1 C) (Temporal)   Ht 5\' 6"  (1.676 m)   Wt 181 lb (82.1 kg)   SpO2 99%   BMI 29.21 kg/m   Wt Readings from Last 3 Encounters:  04/08/23 177 lb (80.3 kg)  03/29/23 178 lb (80.7  kg)  11/20/22 180 lb (81.6 kg)    General: Appears her stated age, overweight, in NAD. Skin: Warm, dry and intact.  HEENT: Head: normal shape and size; Eyes: sclera white, no icterus, conjunctiva pink, PERRLA and EOMs intact;  Neck:  Neck supple, trachea midline. No masses, lumps or thyromegaly present.  Cardiovascular: Normal rate and rhythm. S1,S2 noted.  No murmur, rubs or gallops noted. No JVD or BLE edema. No carotid bruits noted. Pulmonary/Chest: Normal effort and positive vesicular breath sounds. No respiratory distress. No wheezes, rales or ronchi noted.  Abdomen: Normal bowel sounds.  Musculoskeletal: Strength 5/5 BUE/BLE. No difficulty with gait.  Neurological: Alert and oriented.  Cranial nerves II-XII grossly intact. Coordination normal.  Psychiatric: Mood and affect normal. Behavior is normal. Judgment and thought content normal.     BMET    Component Value Date/Time   NA 138 03/29/2023 0856   NA 135 06/20/2021 1630   K 4.3 03/29/2023 0856   CL 107 03/29/2023 0856   CO2 24 03/29/2023 0856   GLUCOSE 106 (H) 03/29/2023 0856   BUN 13 03/29/2023 0856   BUN 14 06/20/2021 1630   CREATININE 0.60 03/29/2023 0856   CALCIUM 9.6 03/29/2023 0856    Lipid Panel     Component Value Date/Time   CHOL 180 07/21/2022 0000   CHOL 226 (H) 07/31/2021 1507   TRIG 106 07/21/2022 0000   HDL 51 07/21/2022 0000   HDL 45 07/31/2021 1507   CHOLHDL 5.0 (H) 07/31/2021 1507   LDLCALC 110 07/21/2022 0000   LDLCALC 135 (H) 07/31/2021 1507    CBC    Component Value Date/Time   WBC 7.2 06/20/2021 1630   RBC 4.49 06/20/2021 1630   HGB 13.8 06/20/2021 1630   HCT 40.6 06/20/2021 1630   PLT 283 06/20/2021 1630   MCV 90 06/20/2021 1630   MCH 30.7 06/20/2021 1630   MCHC 34.0 06/20/2021 1630   RDW 11.4 (L) 06/20/2021 1630   LYMPHSABS 2.6 06/20/2021 1630   EOSABS 0.1 06/20/2021 1630   BASOSABS 0.0 06/20/2021 1630    Hgb A1C Lab Results  Component Value Date   HGBA1C 5.7 07/21/2022             Assessment & Plan:   Preventative Health Maintenance:  Encouraged her to get a flu shot in the fall Tetanus UTD Encouraged her to get her COVID-vaccine Discussed Shingrix vaccine, she will check coverage with her insurance company and schedule visit she would like to have this done Pap smear UTD  Mammogram UTD Colon screening UTD Encouraged her to consume a balanced diet and exercise regimen Advised her to see an eye doctor and dentist annually We will check CBC, c-Met, lipid, A1c today  RTC in 6 months, follow-up chronic conditions Nicki Reaper, NP

## 2023-05-21 NOTE — Patient Instructions (Signed)

## 2023-05-21 NOTE — Assessment & Plan Note (Signed)
Encourage diet and exercise for weight loss 

## 2023-06-05 LAB — CBC
Hematocrit: 40.8 % (ref 34.0–46.6)
Hemoglobin: 13.2 g/dL (ref 11.1–15.9)
MCH: 28.5 pg (ref 26.6–33.0)
MCHC: 32.4 g/dL (ref 31.5–35.7)
MCV: 88 fL (ref 79–97)
Platelets: 306 10*3/uL (ref 150–450)
RBC: 4.63 x10E6/uL (ref 3.77–5.28)
RDW: 12.4 % (ref 11.7–15.4)
WBC: 8.2 10*3/uL (ref 3.4–10.8)

## 2023-06-05 LAB — LIPID PANEL
Chol/HDL Ratio: 3.8 ratio (ref 0.0–4.4)
Cholesterol, Total: 180 mg/dL (ref 100–199)
HDL: 47 mg/dL (ref >39)
LDL Chol Calc (NIH): 103 mg/dL — ABNORMAL HIGH (ref 0–99)
Triglycerides: 171 mg/dL — ABNORMAL HIGH (ref 0–149)
VLDL Cholesterol Cal: 30 mg/dL (ref 5–40)

## 2023-06-05 LAB — COMPREHENSIVE METABOLIC PANEL
ALT: 15 IU/L (ref 0–32)
AST: 13 IU/L (ref 0–40)
Albumin: 4.4 g/dL (ref 3.8–4.9)
Alkaline Phosphatase: 83 IU/L (ref 44–121)
BUN/Creatinine Ratio: 14 (ref 9–23)
BUN: 12 mg/dL (ref 6–24)
Bilirubin Total: <0.2 mg/dL (ref 0.0–1.2)
CO2: 21 mmol/L (ref 20–29)
Calcium: 9.5 mg/dL (ref 8.7–10.2)
Chloride: 105 mmol/L (ref 96–106)
Creatinine, Ser: 0.83 mg/dL (ref 0.57–1.00)
Globulin, Total: 2.6 g/dL (ref 1.5–4.5)
Glucose: 110 mg/dL — ABNORMAL HIGH (ref 70–99)
Potassium: 4.1 mmol/L (ref 3.5–5.2)
Sodium: 140 mmol/L (ref 134–144)
Total Protein: 7 g/dL (ref 6.0–8.5)
eGFR: 85 mL/min/{1.73_m2} (ref >59)

## 2023-06-05 LAB — HEMOGLOBIN A1C
Est. average glucose Bld gHb Est-mCnc: 123 mg/dL
Hgb A1c MFr Bld: 5.9 % — ABNORMAL HIGH (ref 4.8–5.6)

## 2023-06-25 ENCOUNTER — Other Ambulatory Visit: Payer: Self-pay | Admitting: Family Medicine

## 2023-06-25 DIAGNOSIS — I10 Essential (primary) hypertension: Secondary | ICD-10-CM

## 2023-06-25 NOTE — Telephone Encounter (Signed)
Requested Prescriptions  Pending Prescriptions Disp Refills   olmesartan (BENICAR) 20 MG tablet [Pharmacy Med Name: Olmesartan Medoxomil 20 MG Oral Tablet] 90 tablet 1    Sig: TAKE 1 TABLET BY MOUTH DAILY     Cardiovascular:  Angiotensin Receptor Blockers Passed - 06/25/2023  8:26 AM      Passed - Cr in normal range and within 180 days    Creat  Date Value Ref Range Status  03/29/2023 0.60 0.50 - 1.03 mg/dL Final   Creatinine, Ser  Date Value Ref Range Status  06/04/2023 0.83 0.57 - 1.00 mg/dL Final         Passed - K in normal range and within 180 days    Potassium  Date Value Ref Range Status  06/04/2023 4.1 3.5 - 5.2 mmol/L Final         Passed - Patient is not pregnant      Passed - Last BP in normal range    BP Readings from Last 1 Encounters:  05/21/23 124/72         Passed - Valid encounter within last 6 months    Recent Outpatient Visits           1 month ago Encounter for general adult medical examination with abnormal findings   [facility] [place], Salvadore Oxford, NP   2 months ago Primary hypertension   [facility] [place], Salvadore Oxford, NP   7 months ago Need for influenza vaccination   [place] [facility] [place], Salvadore Oxford, NP   7 months ago Primary hypertension   [place] [facility] [place], Salvadore Oxford, NP   1 year ago Hypertension, unspecified type   [facility] Inches, MD       Future Appointments             In 5 months [name], Salvadore Oxford, NP [facility], [facility]

## 2023-06-28 ENCOUNTER — Ambulatory Visit
Admission: RE | Admit: 2023-06-28 | Discharge: 2023-06-28 | Disposition: A | Discharge status: Home / Self Care | Payer: Managed Care, Other (non HMO) | Source: Ambulatory Visit | Attending: Obstetrics and Gynecology | Admitting: Obstetrics and Gynecology

## 2023-06-28 DIAGNOSIS — Z9189 Other specified personal risk factors, not elsewhere classified: Secondary | ICD-10-CM

## 2023-06-28 DIAGNOSIS — Z1239 Encounter for other screening for malignant neoplasm of breast: Secondary | ICD-10-CM

## 2023-06-28 DIAGNOSIS — Z803 Family history of malignant neoplasm of breast: Secondary | ICD-10-CM

## 2023-06-28 MED ORDER — GADOPICLENOL 0.5 MMOL/ML IV SOLN
9.0000 mL | Freq: Once | INTRAVENOUS | Status: AC | PRN
  Administered 2023-06-28: 9 mL via INTRAVENOUS

## 2023-06-30 ENCOUNTER — Encounter: Payer: Self-pay | Admitting: Internal Medicine

## 2023-06-30 DIAGNOSIS — E0590 Thyrotoxicosis, unspecified without thyrotoxic crisis or storm: Secondary | ICD-10-CM

## 2023-07-06 ENCOUNTER — Encounter: Payer: Self-pay | Admitting: Obstetrics and Gynecology

## 2023-07-06 ENCOUNTER — Other Ambulatory Visit: Payer: Self-pay | Admitting: Obstetrics and Gynecology

## 2023-07-06 MED ORDER — NORETHINDRONE ACETATE 5 MG PO TABS: 5.0000 mg | ORAL_TABLET | Freq: Every day | ORAL | 2 refills | Status: DC

## 2023-07-06 NOTE — Progress Notes (Signed)
Rx POP change to aygestin. BTB with micronor. F/u prn.

## 2023-09-01 ENCOUNTER — Other Ambulatory Visit: Payer: Self-pay | Admitting: Obstetrics and Gynecology

## 2023-11-10 ENCOUNTER — Other Ambulatory Visit: Payer: Self-pay | Admitting: Internal Medicine

## 2023-11-10 DIAGNOSIS — I10 Essential (primary) hypertension: Secondary | ICD-10-CM

## 2023-11-11 NOTE — Telephone Encounter (Signed)
Requested Prescriptions  Pending Prescriptions Disp Refills   olmesartan (BENICAR) 20 MG tablet [Pharmacy Med Name: Olmesartan Medoxomil 20 MG Oral Tablet] 90 tablet 1    Sig: TAKE 1 TABLET BY MOUTH DAILY     Cardiovascular:  Angiotensin Receptor Blockers Passed - 11/10/2023  5:33 AM      Passed - Cr in normal range and within 180 days    Creat  Date Value Ref Range Status  03/29/2023 0.60 0.50 - 1.03 mg/dL Final   Creatinine, Ser  Date Value Ref Range Status  06/04/2023 0.83 0.57 - 1.00 mg/dL Final         Passed - K in normal range and within 180 days    Potassium  Date Value Ref Range Status  06/04/2023 4.1 3.5 - 5.2 mmol/L Final         Passed - Patient is not pregnant      Passed - Last BP in normal range    BP Readings from Last 1 Encounters:  05/21/23 124/72         Passed - Valid encounter within last 6 months    Recent Outpatient Visits           5 months ago Encounter for general adult medical examination with abnormal findings   [facility] [place], Salvadore Oxford, NP   7 months ago Primary hypertension   [place] [facility] [place], Salvadore Oxford, NP   11 months ago Need for influenza vaccination   [place] [facility] [place], Salvadore Oxford, NP   1 year ago Primary hypertension   [place] [facility] [place], Salvadore Oxford, NP   1 year ago Hypertension, unspecified type   [facility], MD       Future Appointments             In 1 week [name], Salvadore Oxford, NP [place] [facility], [facility]

## 2023-11-24 ENCOUNTER — Encounter: Payer: Self-pay | Admitting: Internal Medicine

## 2023-11-24 ENCOUNTER — Ambulatory Visit: Payer: Managed Care, Other (non HMO) | Admitting: Internal Medicine

## 2023-11-24 VITALS — BP 122/80 | Ht 66.0 in | Wt 167.6 lb

## 2023-11-24 DIAGNOSIS — R7303 Prediabetes: Secondary | ICD-10-CM

## 2023-11-24 DIAGNOSIS — E0590 Thyrotoxicosis, unspecified without thyrotoxic crisis or storm: Secondary | ICD-10-CM | POA: Diagnosis not present

## 2023-11-24 DIAGNOSIS — E782 Mixed hyperlipidemia: Secondary | ICD-10-CM | POA: Diagnosis not present

## 2023-11-24 DIAGNOSIS — I10 Essential (primary) hypertension: Secondary | ICD-10-CM | POA: Diagnosis not present

## 2023-11-24 DIAGNOSIS — Z6827 Body mass index (BMI) 27.0-27.9, adult: Secondary | ICD-10-CM

## 2023-11-24 DIAGNOSIS — J4520 Mild intermittent asthma, uncomplicated: Secondary | ICD-10-CM

## 2023-11-24 DIAGNOSIS — K219 Gastro-esophageal reflux disease without esophagitis: Secondary | ICD-10-CM

## 2023-11-24 DIAGNOSIS — H409 Unspecified glaucoma: Secondary | ICD-10-CM

## 2023-11-24 DIAGNOSIS — E663 Overweight: Secondary | ICD-10-CM

## 2023-11-24 NOTE — Patient Instructions (Signed)
Prediabetes Eating Plan Prediabetes is a condition that causes blood sugar (glucose) levels to be higher than normal. This increases the risk for developing type 2 diabetes (type 2 diabetes mellitus). Working with a health care provider or nutrition specialist (dietitian) to make diet and lifestyle changes can help prevent the onset of diabetes. These changes may help you: Control your blood glucose levels. Improve your cholesterol levels. Manage your blood pressure. What are tips for following this plan? Reading food labels Read food labels to check the amount of fat, salt (sodium), and sugar in prepackaged foods. Avoid foods that have: Saturated fats. Trans fats. Added sugars. Avoid foods that have more than 300 milligrams (mg) of sodium per serving. Limit your sodium intake to less than 2,300 mg each day. Shopping Avoid buying pre-made and processed foods. Avoid buying drinks with added sugar. Cooking Cook with olive oil. Do not use butter, lard, or ghee. Bake, broil, grill, steam, or boil foods. Avoid frying. Meal planning  Work with your dietitian to create an eating plan that is right for you. This may include tracking how many calories you take in each day. Use a food diary, notebook, or mobile application to track what you eat at each meal. Consider following a Mediterranean diet. This includes: Eating several servings of fresh fruits and vegetables each day. Eating fish at least twice a week. Eating one serving each day of whole grains, beans, nuts, and seeds. Using olive oil instead of other fats. Limiting alcohol. Limiting red meat. Using nonfat or low-fat dairy products. Consider following a plant-based diet. This includes dietary choices that focus on eating mostly vegetables and fruit, grains, beans, nuts, and seeds. If you have high blood pressure, you may need to limit your sodium intake or follow a diet such as the DASH (Dietary Approaches to Stop Hypertension) eating  plan. The DASH diet aims to lower high blood pressure. Lifestyle Set weight loss goals with help from your health care team. It is recommended that most people with prediabetes lose 7% of their body weight. Exercise for at least 30 minutes 5 or more days a week. Attend a support group or seek support from a mental health counselor. Take over-the-counter and prescription medicines only as told by your health care provider. What foods are recommended? Fruits Berries. Bananas. Apples. Oranges. Grapes. Papaya. Mango. Pomegranate. Kiwi. Grapefruit. Cherries. Vegetables Lettuce. Spinach. Peas. Beets. Cauliflower. Cabbage. Broccoli. Carrots. Tomatoes. Squash. Eggplant. Herbs. Peppers. Onions. Cucumbers. Brussels sprouts. Grains Whole grains, such as whole-wheat or whole-grain breads, crackers, cereals, and pasta. Unsweetened oatmeal. Bulgur. Barley. Quinoa. Brown rice. Corn or whole-wheat flour tortillas or taco shells. Meats and other proteins Seafood. Poultry without skin. Lean cuts of pork and beef. Tofu. Eggs. Nuts. Beans. Dairy Low-fat or fat-free dairy products, such as yogurt, cottage cheese, and cheese. Beverages Water. Tea. Coffee. Sugar-free or diet soda. Seltzer water. Low-fat or nonfat milk. Milk alternatives, such as soy or almond milk. Fats and oils Olive oil. Canola oil. Sunflower oil. Grapeseed oil. Avocado. Walnuts. Sweets and desserts Sugar-free or low-fat pudding. Sugar-free or low-fat ice cream and other frozen treats. Seasonings and condiments Herbs. Sodium-free spices. Mustard. Relish. Low-salt, low-sugar ketchup. Low-salt, low-sugar barbecue sauce. Low-fat or fat-free mayonnaise. The items listed above may not be a complete list of recommended foods and beverages. Contact a dietitian for more information. What foods are not recommended? Fruits Fruits canned with syrup. Vegetables Canned vegetables. Frozen vegetables with butter or cream sauce. Grains Refined white  flour and flour   products, such as bread, pasta, snack foods, and cereals. Meats and other proteins Fatty cuts of meat. Poultry with skin. Breaded or fried meat. Processed meats. Dairy Full-fat yogurt, cheese, or milk. Beverages Sweetened drinks, such as iced tea and soda. Fats and oils Butter. Lard. Ghee. Sweets and desserts Baked goods, such as cake, cupcakes, pastries, cookies, and cheesecake. Seasonings and condiments Spice mixes with added salt. Ketchup. Barbecue sauce. Mayonnaise. The items listed above may not be a complete list of foods and beverages that are not recommended. Contact a dietitian for more information. Where to find more information American Diabetes Association: www.diabetes.org Summary You may need to make diet and lifestyle changes to help prevent the onset of diabetes. These changes can help you control blood sugar, improve cholesterol levels, and manage blood pressure. Set weight loss goals with help from your health care team. It is recommended that most people with prediabetes lose 7% of their body weight. Consider following a Mediterranean diet. This includes eating plenty of fresh fruits and vegetables, whole grains, beans, nuts, seeds, fish, and low-fat dairy, and using olive oil instead of other fats. This information is not intended to replace advice given to you by your health care provider. Make sure you discuss any questions you have with your health care provider. Document Revised: 03/07/2020 Document Reviewed: 03/07/2020 Elsevier Patient Education  2024 Elsevier Inc.  

## 2023-11-24 NOTE — Assessment & Plan Note (Signed)
Encourage diet and exercise for weight loss 

## 2023-11-24 NOTE — Assessment & Plan Note (Signed)
She will continue to follow with ophthalmology 

## 2023-11-24 NOTE — Assessment & Plan Note (Signed)
Controlled on olmesartan Reinforced DASH diet and exercise for weight loss C-Met today 

## 2023-11-24 NOTE — Assessment & Plan Note (Signed)
Continue montelukast and albuterol as needed She will continue to follow with pulmonology

## 2023-11-24 NOTE — Progress Notes (Signed)
Subjective:    Patient ID: Nicole Harrell, female    DOB: Jan 03, 1971, 52 y.o.   MRN: [id number]  HPI  Patient presents to clinic today for followup chronic conditions.  HTN: Her BP today is 122/80.  She is taking olmesartan as prescribed.  ECG from 03/2023 reviewed.  Reactive airway disease: Managed with montelukast and albuterol, taken only as needed.  She is not taking breo as prescribed because she feels like her asthma symptoms seem to be improving and she does not want to take the daily steriod.  There are no PFTs on file.  She follows with pulmonology.  GERD: Currently not an issue. She is not taking any medication for this. There is no upper GI on file.  Prediabetes: Her last A1c was 5.9%, 05/2023.  She is not taking any oral diabetic medication at this time.  She does not check her sugars.  Glaucoma: Managed with simbrinza prescribed by ophthalmology.  HLD: Her last LDL was 103, triglycerides 171, 05/2023. She is not taking any oral cholesterol-lowering medication at this time.  She tries to consume low-fat diet.  Hyperthyroidism: She is not taking methimazole as prescribed, trying to manage this naturally with diet and lifestyle changes.  She follows with endocrinology.  Review of Systems     Past Medical History:  Diagnosis Date   Asthma    BRCA negative 2012, 2/19   BRCA neg 2012; MyRIsk neg 2019   Depression    Family history of breast cancer    Family history of ovarian cancer    6/12 BRCA Neg   Glaucoma    Hypertension    Increased risk of breast cancer 01/2018   IBIS=21%/riskscore=34.5%   Plantar fasciitis     Current Outpatient Medications  Medication Sig Dispense Refill   albuterol (VENTOLIN HFA) 108 (90 Base) MCG/ACT inhaler TAKE 2 PUFFS BY MOUTH EVERY 6 HOURS AS NEEDED FOR WHEEZE OR SHORTNESS OF BREATH 8.5 each 1   cetirizine (ZYRTEC) 10 MG tablet Take 1 tablet by mouth daily.     Cholecalciferol (VITAMIN D-3) 5000 units TABS Take by mouth.      fluticasone furoate-vilanterol (BREO ELLIPTA) 100-25 MCG/ACT AEPB 1 puff Inhalation Once a day for 30 days     methimazole (TAPAZOLE) 5 MG tablet      montelukast (SINGULAIR) 10 MG tablet Take 10 mg by mouth at bedtime.     norethindrone (AYGESTIN) 5 MG tablet Take 1 tablet (5 mg total) by mouth daily. 90 tablet 2   olmesartan (BENICAR) 20 MG tablet TAKE 1 TABLET BY MOUTH DAILY 90 tablet 1   SIMBRINZA 1-0.2 % SUSP Apply 1 drop to eye 2 (two) times daily.     No current facility-administered medications for this visit.    Allergies  Allergen Reactions   Iodinated Contrast Media Hives   Other Hives    Seasonal, cats, dogs   Penicillins Hives    Family History  Problem Relation Age of Onset   Breast cancer Mother 74   Asthma Mother    Diabetes Mother    Heart attack Mother    Bladder Cancer Father    Hyperlipidemia Father    Hypertension Father    Asthma Brother    Depression Brother    Hypertension Brother    Learning disabilities Brother    Mental illness Brother    Alzheimer's disease Maternal Grandmother    Stomach cancer Paternal Grandmother    Breast cancer Paternal Aunt 58   Ovarian cancer  Paternal Aunt 44   Colon cancer Paternal Uncle 13    Social History   Socioeconomic History   Marital status: Married    Spouse name: Not on file   Number of children: Not on file   Years of education: Not on file   Highest education level: Associate degree: occupational, Scientist, product/process development, or vocational program  Occupational History   Not on file  Tobacco Use   Smoking status: Never   Smokeless tobacco: Never  Vaping Use   Vaping status: Never Used  Substance and Sexual Activity   Alcohol use: No   Drug use: No   Sexual activity: Yes    Birth control/protection: Pill  Other Topics Concern   Not on file  Social History Narrative   Works at [employer], Teacher, music.     Social Determinants of Health   Financial Resource Strain: Low Risk  (11/21/2023)   Overall  Financial Resource Strain (CARDIA)    Difficulty of Paying Living Expenses: Not hard at all  Food Insecurity: No Food Insecurity (11/21/2023)   Hunger Vital Sign    Worried About Running Out of Food in the Last Year: Never true    Ran Out of Food in the Last Year: Never true  Transportation Needs: No Transportation Needs (11/21/2023)   PRAPARE - Administrator, Civil Service (Medical): No    Lack of Transportation (Non-Medical): No  Physical Activity: Insufficiently Active (11/21/2023)   Exercise Vital Sign    Days of Exercise per Week: 2 days    Minutes of Exercise per Session: 30 min  Stress: No Stress Concern Present (11/21/2023)   [employer] of Occupational Health - Occupational Stress Questionnaire    Feeling of Stress : Only a little  Social Connections: Socially Integrated (11/21/2023)   Social Connection and Isolation Panel [NHANES]    Frequency of Communication with Friends and Family: More than three times a week    Frequency of Social Gatherings with Friends and Family: Once a week    Attends Religious Services: More than 4 times per year    Active Member of [employer] or Organizations: Yes    Attends Engineer, structural: More than 4 times per year    Marital Status: Married  Catering manager Violence: Not on file     Constitutional: Denies fever, malaise, fatigue, headache or abrupt weight changes.  HEENT: Denies eye pain, eye redness, ear pain, wax buildup, runny nose, nasal congestion, bloody nose, or sore throat. Respiratory: Denies difficulty breathing, shortness of breath, cough or sputum production.   Cardiovascular: Denies chest pain, chest tightness, palpitations or swelling in the hands or feet.  Gastrointestinal: Denies abdominal pain, bloating, constipation, diarrhea or blood in the stool.  GU: Denies urgency, frequency, pain with urination, burning sensation, blood in urine, odor or discharge. Musculoskeletal: Denies decrease in range of  motion, difficulty with gait, muscle pain or joint pain and swelling.  Skin: Denies redness, rashes, lesions or ulcercations.  Neurological: Denies dizziness, difficulty with memory, difficulty with speech or problems with balance and coordination.  Psych: Denies anxiety, depression, SI/HI.  No other specific complaints in a complete review of systems (except as listed in HPI above).  Objective:   Physical Exam BP 122/80   Ht 5\' 6"  (1.676 m)   Wt 167 lb 9.6 oz (76 kg)   LMP  (LMP Unknown)   BMI 27.05 kg/m    Wt Readings from Last 3 Encounters:  05/21/23 181 lb (82.1 kg)  04/08/23 177 lb (80.3 kg)  03/29/23 178 lb (80.7 kg)    General: Appears her stated age, overweight, in NAD. Skin: Warm, dry and intact.  HEENT: Head: normal shape and size; Eyes: sclera white, no icterus, conjunctiva pink, PERRLA and EOMs intact;  Neck:  Neck supple, trachea midline. No masses, lumps or thyromegaly present.  Cardiovascular: Normal rate and rhythm.  No murmurs, rubs or gallops noted.  No carotid bruits noted. Pulmonary/Chest: Normal effort and positive vesicular breath sounds.  Musculoskeletal: No difficulty with gait.  Neurological: Alert and oriented.   Psychiatric: Mood and affect normal. Behavior is normal. Judgment and thought content normal.    BMET    Component Value Date/Time   NA 140 06/04/2023 1415   K 4.1 06/04/2023 1415   CL 105 06/04/2023 1415   CO2 21 06/04/2023 1415   GLUCOSE 110 (H) 06/04/2023 1415   GLUCOSE 106 (H) 03/29/2023 0856   BUN 12 06/04/2023 1415   CREATININE 0.83 06/04/2023 1415   CREATININE 0.60 03/29/2023 0856   CALCIUM 9.5 06/04/2023 1415    Lipid Panel     Component Value Date/Time   CHOL 180 06/04/2023 1415   TRIG 171 (H) 06/04/2023 1415   HDL 47 06/04/2023 1415   CHOLHDL 3.8 06/04/2023 1415   LDLCALC 103 (H) 06/04/2023 1415    CBC    Component Value Date/Time   WBC 8.2 06/04/2023 1415   RBC 4.63 06/04/2023 1415   HGB 13.2 06/04/2023 1415    HCT 40.8 06/04/2023 1415   PLT 306 06/04/2023 1415   MCV 88 06/04/2023 1415   MCH 28.5 06/04/2023 1415   MCHC 32.4 06/04/2023 1415   RDW 12.4 06/04/2023 1415   LYMPHSABS 2.6 06/20/2021 1630   EOSABS 0.1 06/20/2021 1630   BASOSABS 0.0 06/20/2021 1630    Hgb A1C Lab Results  Component Value Date   HGBA1C 5.9 (H) 06/04/2023           Assessment & Plan:      RTC in 6 months for your annual exam Nicki Reaper, NP

## 2023-11-24 NOTE — Assessment & Plan Note (Signed)
TSH and free T4 today Not taking methimazole, will D/C from list She will continue to follow with endocrinology

## 2023-11-24 NOTE — Assessment & Plan Note (Signed)
C-Met and lipid profile today Encourage her to consume a low-fat diet

## 2023-11-24 NOTE — Assessment & Plan Note (Signed)
A1c today Encourage low-carb diet and exercise for weight loss

## 2023-11-24 NOTE — Assessment & Plan Note (Signed)
Currently not an issue °We will monitor °

## 2023-11-25 LAB — COMPLETE METABOLIC PANEL WITH GFR
AG Ratio: 1.6 (calc) (ref 1.0–2.5)
ALT: 17 U/L (ref 6–29)
AST: 15 U/L (ref 10–35)
Albumin: 4.3 g/dL (ref 3.6–5.1)
Alkaline phosphatase (APISO): 31 U/L — ABNORMAL LOW (ref 37–153)
BUN: 18 mg/dL (ref 7–25)
CO2: 22 mmol/L (ref 20–32)
Calcium: 9 mg/dL (ref 8.6–10.4)
Chloride: 105 mmol/L (ref 98–110)
Creat: 0.76 mg/dL (ref 0.50–1.03)
Globulin: 2.7 g/dL (ref 1.9–3.7)
Glucose, Bld: 94 mg/dL (ref 65–99)
Potassium: 4.4 mmol/L (ref 3.5–5.3)
Sodium: 135 mmol/L (ref 135–146)
Total Bilirubin: 0.4 mg/dL (ref 0.2–1.2)
Total Protein: 7 g/dL (ref 6.1–8.1)
eGFR: 94 mL/min/{1.73_m2} (ref >=60)

## 2023-11-25 LAB — CBC
HCT: 41 % (ref 35.0–45.0)
Hemoglobin: 13.8 g/dL (ref 11.7–15.5)
MCH: 31.6 pg (ref 27.0–33.0)
MCHC: 33.7 g/dL (ref 32.0–36.0)
MCV: 93.8 fL (ref 80.0–100.0)
MPV: 10.7 fL (ref 7.5–12.5)
Platelets: 253 10*3/uL (ref 140–400)
RBC: 4.37 10*6/uL (ref 3.80–5.10)
RDW: 11.4 % (ref 11.0–15.0)
WBC: 5.7 10*3/uL (ref 3.8–10.8)

## 2023-11-25 LAB — LIPID PANEL
Cholesterol: 181 mg/dL (ref <200)
HDL: 42 mg/dL — ABNORMAL LOW (ref >=50)
LDL Cholesterol (Calc): 121 mg/dL — ABNORMAL HIGH
Non-HDL Cholesterol (Calc): 139 mg/dL — ABNORMAL HIGH (ref <130)
Total CHOL/HDL Ratio: 4.3 (calc) (ref <5.0)
Triglycerides: 81 mg/dL (ref <150)

## 2023-11-25 LAB — HEMOGLOBIN A1C
Hgb A1c MFr Bld: 5.5 %{Hb} (ref <5.7)
Mean Plasma Glucose: 111 mg/dL
eAG (mmol/L): 6.2 mmol/L

## 2023-11-25 LAB — TSH: TSH: 0.73 m[IU]/L

## 2023-11-25 LAB — T4, FREE: Free T4: 1 ng/dL (ref 0.8–1.8)

## 2023-12-27 ENCOUNTER — Other Ambulatory Visit: Payer: Self-pay | Admitting: Obstetrics and Gynecology

## 2023-12-27 DIAGNOSIS — Z1231 Encounter for screening mammogram for malignant neoplasm of breast: Secondary | ICD-10-CM

## 2024-02-01 ENCOUNTER — Ambulatory Visit
Admission: RE | Admit: 2024-02-01 | Discharge: 2024-02-01 | Disposition: A | Discharge status: Home / Self Care | Payer: Managed Care, Other (non HMO) | Source: Ambulatory Visit | Attending: Obstetrics and Gynecology | Admitting: Obstetrics and Gynecology

## 2024-02-01 DIAGNOSIS — Z1231 Encounter for screening mammogram for malignant neoplasm of breast: Secondary | ICD-10-CM | POA: Insufficient documentation

## 2024-02-03 ENCOUNTER — Encounter: Payer: Self-pay | Admitting: Obstetrics and Gynecology

## 2024-03-31 ENCOUNTER — Other Ambulatory Visit: Payer: Self-pay | Admitting: Obstetrics and Gynecology

## 2024-04-03 ENCOUNTER — Other Ambulatory Visit: Payer: Self-pay | Admitting: Internal Medicine

## 2024-04-03 DIAGNOSIS — I10 Essential (primary) hypertension: Secondary | ICD-10-CM

## 2024-04-04 NOTE — Telephone Encounter (Signed)
 Requested Prescriptions  Pending Prescriptions Disp Refills   olmesartan (BENICAR) 20 MG tablet [Pharmacy Med Name: Olmesartan Medoxomil 20 MG Oral Tablet] 90 tablet 0    Sig: TAKE 1 TABLET BY MOUTH DAILY     Cardiovascular:  Angiotensin Receptor Blockers Failed - 04/04/2024  4:06 PM      Failed - Valid encounter within last 6 months    Recent Outpatient Visits   None     Future Appointments             In 1 month [name], [name], NP [facility]            Passed - Cr in normal range and within 180 days    Creat  Date Value Ref Range Status  11/24/2023 0.76 0.50 - 1.03 mg/dL Final         Passed - K in normal range and within 180 days    Potassium  Date Value Ref Range Status  11/24/2023 4.4 3.5 - 5.3 mmol/L Final         Passed - Patient is not pregnant      Passed - Last BP in normal range    BP Readings from Last 1 Encounters:  11/24/23 122/80

## 2024-04-16 NOTE — Progress Notes (Unsigned)
 PCP:  Carollynn Cirri, NP   No chief complaint on file.    HPI:      [name] is a 53 y.o. [phone] whose LMP was No LMP recorded. (Menstrual status: Oral contraceptives)., presents today for her annual examination.  Her menses are every couple months with POPs, lasting 5-7 days, mod flow vs spotting only. Dysmenorrhea mild. No vasomotor sx.   Sex activity: sexually active--contraception POPs due to HTN. No pain/bleeding/dryness Last Pap: 10/28/20 Results were: no abnormalities /neg HPV DNA  Hx of STDs: none  Last mammogram: 02/01/24 Results were: normal--routine follow-up in 12 months. Neg RT breast bx 2/23; hx of RT breast cysts too. Neg breast MRI 7/24 There is a FH of breast cancer in her mom and pat aunt. There is a FH of ovarian cancer in her pat aunt, and colon cancer in her pat uncle. The patient does do self-breast exams. BRCA neg 6/12; neg MyRisk update testing 2/20. Riskscore=34.5%/IBIS=21%. Taking Vit D supp. Pt aware of scr breast MRI option in conjunction with yearly mammo, but not done. Interested now given hx of breast bx.   Tobacco use: The patient denies current or previous tobacco use. Alcohol use: none No drug use.  Exercise: mod active  Colonoscopy: 1/23 with polyp with Dr. Ole Berkeley, repeat due after 7 yrs  She does get adequate calcium and Vitamin D  in her diet.  Labs with PCP/work screening.   Past Medical History:  Diagnosis Date   Asthma    BRCA negative 2012, 2/19   BRCA neg 2012; MyRIsk neg 2019   Depression    Family history of breast cancer    Family history of ovarian cancer    6/12 BRCA Neg   Glaucoma    Hypertension    Increased risk of breast cancer 01/2018   IBIS=21%/riskscore=34.5%   Plantar fasciitis     Past Surgical History:  Procedure Laterality Date   BREAST BIOPSY Right 01/21/2022   US  bx, heart marker, negative   COLONOSCOPY WITH PROPOFOL  N/A 01/02/2022   Procedure: COLONOSCOPY WITH PROPOFOL ;  Surgeon: Marnee Sink, MD;  Location: [facility];  Service: Endoscopy;  Laterality: N/A;   LYMPH NODE BIOPSY     POLYPECTOMY N/A 01/02/2022   Procedure: POLYPECTOMY;  Surgeon: Marnee Sink, MD;  Location: [facility];  Service: Endoscopy;  Laterality: N/A;    Family History  Problem Relation Age of Onset   Breast cancer Mother 55   Asthma Mother    Diabetes Mother    Heart attack Mother    Bladder Cancer Father    Hyperlipidemia Father    Hypertension Father    Asthma Brother    Depression Brother    Hypertension Brother    Learning disabilities Brother    Mental illness Brother    Alzheimer's disease Maternal Grandmother    Stomach cancer Paternal Grandmother    Breast cancer Paternal Aunt 72   Ovarian cancer Paternal Aunt 10   Colon cancer Paternal Uncle 31    Social History   Socioeconomic History   Marital status: Married    Spouse name: Not on file   Number of children: Not on file   Years of education: Not on file   Highest education level: Associate degree: occupational, Scientist, product/process development, or vocational program  Occupational History   Not on file  Tobacco Use   Smoking status: Never   Smokeless tobacco: Never  Vaping Use   Vaping status: Never Used  Substance  and Sexual Activity   Alcohol use: No   Drug use: No   Sexual activity: Yes    Birth control/protection: Pill  Other Topics Concern   Not on file  Social History Narrative   Works at [employer], Teacher, music.     Social Drivers of Corporate investment banker Strain: Low Risk  (11/21/2023)   Overall Financial Resource Strain (CARDIA)    Difficulty of Paying Living Expenses: Not hard at all  Food Insecurity: No Food Insecurity (11/21/2023)   Hunger Vital Sign    Worried About Running Out of Food in the Last Year: Never true    Ran Out of Food in the Last Year: Never true  Transportation Needs: No Transportation Needs (11/21/2023)   PRAPARE - Administrator, Civil Service (Medical): No     Lack of Transportation (Non-Medical): No  Physical Activity: Insufficiently Active (11/21/2023)   Exercise Vital Sign    Days of Exercise per Week: 2 days    Minutes of Exercise per Session: 30 min  Stress: No Stress Concern Present (11/21/2023)   [employer] of Occupational Health - Occupational Stress Questionnaire    Feeling of Stress : Only a little  Social Connections: Socially Integrated (11/21/2023)   Social Connection and Isolation Panel [NHANES]    Frequency of Communication with Friends and Family: More than three times a week    Frequency of Social Gatherings with Friends and Family: Once a week    Attends Religious Services: More than 4 times per year    Active Member of [employer] or Organizations: Yes    Attends Banker Meetings: More than 4 times per year    Marital Status: Married  Catering manager Violence: Patient Declined (11/24/2023)   Humiliation, Afraid, Rape, and Kick questionnaire    Fear of Current or Ex-Partner: Patient declined    Emotionally Abused: Patient declined    Physically Abused: Patient declined    Sexually Abused: Patient declined    No outpatient medications have been marked as taking for the 04/17/24 encounter (Appointment) with Breckon Reeves, Amada Jun, PA-C.     ROS:  Review of Systems  Constitutional:  Negative for fatigue, fever and unexpected weight change.  Respiratory:  Negative for cough, shortness of breath and wheezing.   Cardiovascular:  Negative for chest pain, palpitations and leg swelling.  Gastrointestinal:  Negative for blood in stool, constipation, diarrhea, nausea and vomiting.  Endocrine: Negative for cold intolerance, heat intolerance and polyuria.  Genitourinary:  Negative for dyspareunia, dysuria, flank pain, frequency, genital sores, hematuria, menstrual problem, pelvic pain, urgency, vaginal bleeding, vaginal discharge and vaginal pain.  Musculoskeletal:  Negative for back pain, joint swelling and myalgias.  Skin:   Negative for rash.  Neurological:  Negative for dizziness, syncope, light-headedness, numbness and headaches.  Hematological:  Negative for adenopathy.  Psychiatric/Behavioral:  Negative for agitation, confusion, sleep disturbance and suicidal ideas. The patient is not nervous/anxious.      Objective: There were no vitals taken for this visit.   Physical Exam Constitutional:      Appearance: She is well-developed.  Genitourinary:     Vulva normal.     Right Labia: No rash, tenderness or lesions.    Left Labia: No tenderness, lesions or rash.    No vaginal discharge, erythema or tenderness.      Right Adnexa: not tender and no mass present.    Left Adnexa: not tender and no mass present.    No cervical  motion tenderness, friability or polyp.     Uterus is not enlarged or tender.  Breasts:    Right: No mass, nipple discharge, skin change or tenderness.     Left: No mass, nipple discharge, skin change or tenderness.  Neck:     Thyroid : No thyromegaly.  Cardiovascular:     Rate and Rhythm: Normal rate and regular rhythm.     Heart sounds: Normal heart sounds. No murmur heard. Pulmonary:     Effort: Pulmonary effort is normal.     Breath sounds: Normal breath sounds.  Abdominal:     Palpations: Abdomen is soft.     Tenderness: There is no abdominal tenderness. There is no guarding or rebound.  Musculoskeletal:        General: Normal range of motion.     Cervical back: Normal range of motion.  Lymphadenopathy:     Cervical: No cervical adenopathy.  Neurological:     General: No focal deficit present.     Mental Status: She is alert and oriented to person, place, and time.     Cranial Nerves: No cranial nerve deficit.  Skin:    General: Skin is warm and dry.  Psychiatric:        Mood and Affect: Mood normal.        Behavior: Behavior normal.        Thought Content: Thought content normal.        Judgment: Judgment normal.  Vitals reviewed.      Assessment/Plan: Encounter for annual routine gynecological examination  Encounter for surveillance of contraceptive pills - Plan: norethindrone  (MICRONOR ) 0.35 MG tablet; Rx RF to [facility].   Encounter for screening mammogram for malignant neoplasm of breast--pt current till 2/25.   Family history of breast cancer - Plan: MR BREAST BILATERAL W WO CONTRAST INC CAD  Increased risk of breast cancer - Plan: MR BREAST BILATERAL W WO CONTRAST INC CAD;  aware of recommendations of monthly SBE, yearly CBE and mammos, as well as scr breast MRI. Would like to do MRI. Will schedule for 7/24. Cont Vit D supp.   Encounter for breast cancer screening using non-mammogram modality - Plan: MR BREAST BILATERAL W WO CONTRAST INC CAD   No orders of the defined types were placed in this encounter.           GYN counsel breast self exam, mammography screening, menopause, adequate intake of calcium and vitamin D , diet and exercise     F/U  No follow-ups on file.  Brynnly Bonet B. Anabelen Kaminsky, PA-C 04/16/2024 8:42 PM

## 2024-04-17 ENCOUNTER — Ambulatory Visit (INDEPENDENT_AMBULATORY_CARE_PROVIDER_SITE_OTHER): Payer: Managed Care, Other (non HMO) | Admitting: Obstetrics and Gynecology

## 2024-04-17 ENCOUNTER — Encounter: Payer: Self-pay | Admitting: Obstetrics and Gynecology

## 2024-04-17 VITALS — BP 167/76 | HR 80 | Ht 66.0 in | Wt 184.0 lb

## 2024-04-17 DIAGNOSIS — Z124 Encounter for screening for malignant neoplasm of cervix: Secondary | ICD-10-CM

## 2024-04-17 DIAGNOSIS — R350 Frequency of micturition: Secondary | ICD-10-CM | POA: Diagnosis not present

## 2024-04-17 DIAGNOSIS — N951 Menopausal and female climacteric states: Secondary | ICD-10-CM

## 2024-04-17 DIAGNOSIS — Z3041 Encounter for surveillance of contraceptive pills: Secondary | ICD-10-CM

## 2024-04-17 DIAGNOSIS — Z803 Family history of malignant neoplasm of breast: Secondary | ICD-10-CM

## 2024-04-17 DIAGNOSIS — Z1151 Encounter for screening for human papillomavirus (HPV): Secondary | ICD-10-CM

## 2024-04-17 DIAGNOSIS — Z01419 Encounter for gynecological examination (general) (routine) without abnormal findings: Secondary | ICD-10-CM

## 2024-04-17 DIAGNOSIS — Z1231 Encounter for screening mammogram for malignant neoplasm of breast: Secondary | ICD-10-CM

## 2024-04-17 DIAGNOSIS — Z9189 Other specified personal risk factors, not elsewhere classified: Secondary | ICD-10-CM

## 2024-04-17 LAB — POCT URINALYSIS DIPSTICK
Bilirubin, UA: NEGATIVE
Glucose, UA: NEGATIVE
Ketones, UA: NEGATIVE
Nitrite, UA: NEGATIVE
Protein, UA: NEGATIVE
Spec Grav, UA: 1.02 (ref 1.010–1.025)
pH, UA: 6 (ref 5.0–8.0)

## 2024-04-17 MED ORDER — NORETHINDRONE ACETATE 5 MG PO TABS
5.0000 mg | ORAL_TABLET | Freq: Every day | ORAL | 3 refills | Status: AC
Start: 2024-04-17 — End: ?

## 2024-04-17 MED ORDER — ESTRADIOL 0.025 MG/24HR TD PTWK
0.0250 mg | MEDICATED_PATCH | TRANSDERMAL | 0 refills | Status: AC
Start: 2024-04-17 — End: ?

## 2024-04-17 NOTE — Patient Instructions (Signed)
 I value your feedback and you entrusting [place] with your care. If you get a [place] patient survey, I would appreciate you taking the time to let us know about your experience today. Thank you! ? ? ?

## 2024-04-21 LAB — IGP, APTIMA HPV: HPV Aptima: NEGATIVE

## 2024-04-21 LAB — URINE CULTURE

## 2024-04-23 ENCOUNTER — Encounter: Payer: Self-pay | Admitting: Obstetrics and Gynecology

## 2024-04-23 MED ORDER — NITROFURANTOIN MONOHYD MACRO 100 MG PO CAPS: 100.0000 mg | ORAL_CAPSULE | Freq: Two times a day (BID) | ORAL | 0 refills | Status: AC

## 2024-04-23 NOTE — Addendum Note (Signed)
 Addended by: Alyn Judge B on: 04/23/2024 08:52 PM   Modules accepted: Orders

## 2024-05-22 ENCOUNTER — Ambulatory Visit: Payer: Self-pay | Admitting: Internal Medicine

## 2024-06-06 ENCOUNTER — Other Ambulatory Visit: Payer: Self-pay | Admitting: Obstetrics and Gynecology

## 2024-06-06 ENCOUNTER — Encounter: Payer: Self-pay | Admitting: Obstetrics and Gynecology

## 2024-06-06 DIAGNOSIS — N951 Menopausal and female climacteric states: Secondary | ICD-10-CM

## 2024-06-06 MED ORDER — ESTRADIOL 0.05 MG/24HR TD PTWK: 0.0500 mg | MEDICATED_PATCH | TRANSDERMAL | 0 refills | Status: DC

## 2024-06-06 NOTE — Progress Notes (Signed)
 Rx climara  dose increase to 0.05 mg since no improvement with 0.025mg  dose.

## 2024-06-10 ENCOUNTER — Other Ambulatory Visit: Payer: Self-pay | Admitting: Internal Medicine

## 2024-06-10 DIAGNOSIS — I10 Essential (primary) hypertension: Secondary | ICD-10-CM

## 2024-06-13 NOTE — Telephone Encounter (Signed)
 Requested Prescriptions  Pending Prescriptions Disp Refills   olmesartan  (BENICAR ) 20 MG tablet [Pharmacy Med Name: Olmesartan  Medoxomil 20 MG Oral Tablet] 90 tablet 0    Sig: TAKE 1 TABLET BY MOUTH DAILY     Cardiovascular:  Angiotensin Receptor Blockers Failed - 06/13/2024  1:36 PM      Failed - Cr in normal range and within 180 days    Creat  Date Value Ref Range Status  11/24/2023 0.76 0.50 - 1.03 mg/dL Final         Failed - K in normal range and within 180 days    Potassium  Date Value Ref Range Status  11/24/2023 4.4 3.5 - 5.3 mmol/L Final         Failed - Last BP in normal range    BP Readings from Last 1 Encounters:  04/17/24 (!) 167/76         Failed - Valid encounter within last 6 months    Recent Outpatient Visits   None     Future Appointments             In 1 month [name], Angeline ORN, NP [facility]            Passed - Patient is not pregnant

## 2024-07-21 ENCOUNTER — Ambulatory Visit (INDEPENDENT_AMBULATORY_CARE_PROVIDER_SITE_OTHER): Admitting: Internal Medicine

## 2024-07-21 VITALS — BP 130/84 | Ht 66.0 in | Wt 184.2 lb

## 2024-07-21 DIAGNOSIS — R7303 Prediabetes: Secondary | ICD-10-CM

## 2024-07-21 DIAGNOSIS — E663 Overweight: Secondary | ICD-10-CM | POA: Diagnosis not present

## 2024-07-21 DIAGNOSIS — E782 Mixed hyperlipidemia: Secondary | ICD-10-CM | POA: Diagnosis not present

## 2024-07-21 DIAGNOSIS — Z0001 Encounter for general adult medical examination with abnormal findings: Secondary | ICD-10-CM

## 2024-07-21 DIAGNOSIS — Z6829 Body mass index (BMI) 29.0-29.9, adult: Secondary | ICD-10-CM

## 2024-07-21 LAB — COMPREHENSIVE METABOLIC PANEL WITH GFR
AG Ratio: 1.8 (calc) (ref 1.0–2.5)
ALT: 16 U/L (ref 6–29)
AST: 13 U/L (ref 10–35)
Albumin: 4.6 g/dL (ref 3.6–5.1)
Alkaline phosphatase (APISO): 34 U/L — ABNORMAL LOW (ref 37–153)
BUN: 12 mg/dL (ref 7–25)
CO2: 22 mmol/L (ref 20–32)
Calcium: 9.1 mg/dL (ref 8.6–10.4)
Chloride: 105 mmol/L (ref 98–110)
Creat: 0.7 mg/dL (ref 0.50–1.03)
Globulin: 2.6 g/dL (ref 1.9–3.7)
Glucose, Bld: 90 mg/dL (ref 65–99)
Potassium: 4.3 mmol/L (ref 3.5–5.3)
Sodium: 136 mmol/L (ref 135–146)
Total Bilirubin: 0.5 mg/dL (ref 0.2–1.2)
Total Protein: 7.2 g/dL (ref 6.1–8.1)
eGFR: 104 mL/min/1.73m2 (ref >=60)

## 2024-07-21 LAB — LIPID PANEL
Cholesterol: 210 mg/dL — ABNORMAL HIGH (ref <200)
HDL: 41 mg/dL — ABNORMAL LOW (ref >=50)
LDL Cholesterol (Calc): 143 mg/dL — ABNORMAL HIGH
Non-HDL Cholesterol (Calc): 169 mg/dL — ABNORMAL HIGH (ref <130)
Total CHOL/HDL Ratio: 5.1 (calc) — ABNORMAL HIGH (ref <5.0)
Triglycerides: 139 mg/dL (ref <150)

## 2024-07-21 LAB — CBC
HCT: 43.2 % (ref 35.0–45.0)
Hemoglobin: 14.3 g/dL (ref 11.7–15.5)
MCH: 31.5 pg (ref 27.0–33.0)
MCHC: 33.1 g/dL (ref 32.0–36.0)
MCV: 95.2 fL (ref 80.0–100.0)
MPV: 10.5 fL (ref 7.5–12.5)
Platelets: 243 Thousand/uL (ref 140–400)
RBC: 4.54 Million/uL (ref 3.80–5.10)
RDW: 11.8 % (ref 11.0–15.0)
WBC: 6.7 Thousand/uL (ref 3.8–10.8)

## 2024-07-21 LAB — HEMOGLOBIN A1C
Hgb A1c MFr Bld: 5.7 % — ABNORMAL HIGH (ref <5.7)
Mean Plasma Glucose: 117 mg/dL
eAG (mmol/L): 6.5 mmol/L

## 2024-07-21 MED ORDER — OLMESARTAN MEDOXOMIL 20 MG PO TABS: 20.0000 mg | ORAL_TABLET | Freq: Every day | ORAL | 1 refills | Status: DC

## 2024-07-21 NOTE — Progress Notes (Signed)
 Subjective:    Patient ID: Shewanda Sharpe, female    DOB: 1971-05-05, 53 y.o.   MRN: [id number]  HPI  Patient presents to clinic today for her annual exam.  Flu: 09/2019 Tetanus: 12/2014 COVID: Never Shingrix: Never Pap smear: 03/2024 Mammogram: 01/2024 Colon screening: 12/2021 Vision screening: annually Dentist: biannually  Diet: She does eat meat. She consumes fruits and veggies. She does eat some fried foods. She drinks mostly water , black tea. Exercise: Trainer 2 x week, walking  Review of Systems     Past Medical History:  Diagnosis Date  . Asthma   . BRCA negative 2012, 2/19   BRCA neg 2012; MyRIsk neg 2019  . Depression   . Family history of breast cancer   . Family history of ovarian cancer    6/12 BRCA Neg  . Glaucoma   . Hypertension   . Increased risk of breast cancer 01/2018   IBIS=21%/riskscore=34.5%  . Plantar fasciitis     Current Outpatient Medications  Medication Sig Dispense Refill  . albuterol  (VENTOLIN  HFA) 108 (90 Base) MCG/ACT inhaler TAKE 2 PUFFS BY MOUTH EVERY 6 HOURS AS NEEDED FOR WHEEZE OR SHORTNESS OF BREATH 8.5 each 1  . cetirizine (ZYRTEC) 10 MG tablet Take 1 tablet by mouth daily.    . Cholecalciferol (VITAMIN D -3) 5000 units TABS Take by mouth.    . estradiol  (CLIMARA  - DOSED IN MG/24 HR) 0.05 mg/24hr patch Place 1 patch (0.05 mg total) onto the skin once a week. 12 patch 0  . montelukast (SINGULAIR) 10 MG tablet Take 10 mg by mouth at bedtime.    . norethindrone  (AYGESTIN ) 5 MG tablet Take 1 tablet (5 mg total) by mouth daily. 90 tablet 3  . olmesartan  (BENICAR ) 20 MG tablet TAKE 1 TABLET BY MOUTH DAILY 90 tablet 0  . SIMBRINZA 1-0.2 % SUSP Apply 1 drop to eye 2 (two) times daily.     No current facility-administered medications for this visit.    Allergies  Allergen Reactions  . Iodinated Contrast Media Hives  . Other Hives    Seasonal, cats, dogs  . Penicillins Hives    Family History  Problem Relation Age of Onset   . Breast cancer Mother 54  . Asthma Mother   . Diabetes Mother   . Heart attack Mother   . Bladder Cancer Father   . Hyperlipidemia Father   . Hypertension Father   . Asthma Brother   . Depression Brother   . Hypertension Brother   . Learning disabilities Brother   . Mental illness Brother   . Alzheimer's disease Maternal Grandmother   . Stomach cancer Paternal Grandmother   . Breast cancer Paternal Aunt 23  . Ovarian cancer Paternal Aunt 66  . Colon cancer Paternal Uncle 76    Social History   Socioeconomic History  . Marital status: Married    Spouse name: Not on file  . Number of children: Not on file  . Years of education: Not on file  . Highest education level: Some college, no degree  Occupational History  . Not on file  Tobacco Use  . Smoking status: Never  . Smokeless tobacco: Never  Vaping Use  . Vaping status: Never Used  Substance and Sexual Activity  . Alcohol use: No  . Drug use: No  . Sexual activity: Yes    Birth control/protection: Pill  Other Topics Concern  . Not on file  Social History Narrative   Works at [employer], corporate  office.     Social Drivers of Corporate investment banker Strain: Low Risk  (07/18/2024)   Overall Financial Resource Strain (CARDIA)   . Difficulty of Paying Living Expenses: Not hard at all  Food Insecurity: No Food Insecurity (07/18/2024)   Hunger Vital Sign   . Worried About Programme researcher, broadcasting/film/video in the Last Year: Never true   . Ran Out of Food in the Last Year: Never true  Transportation Needs: No Transportation Needs (07/18/2024)   PRAPARE - Transportation   . Lack of Transportation (Medical): No   . Lack of Transportation (Non-Medical): No  Physical Activity: Insufficiently Active (07/18/2024)   Exercise Vital Sign   . Days of Exercise per Week: 2 days   . Minutes of Exercise per Session: 30 min  Stress: Stress Concern Present (07/18/2024)   [employer] of Occupational Health - Occupational Stress  Questionnaire   . Feeling of Stress: To some extent  Social Connections: Socially Integrated (07/18/2024)   Social Connection and Isolation Panel   . Frequency of Communication with Friends and Family: Twice a week   . Frequency of Social Gatherings with Friends and Family: Once a week   . Attends Religious Services: More than 4 times per year   . Active Member of Clubs or Organizations: Yes   . Attends Banker Meetings: More than 4 times per year   . Marital Status: Married  Catering manager Violence: Patient Declined (11/24/2023)   Humiliation, Afraid, Rape, and Kick questionnaire   . Fear of Current or Ex-Partner: Patient declined   . Emotionally Abused: Patient declined   . Physically Abused: Patient declined   . Sexually Abused: Patient declined     Constitutional: Denies fever, malaise, fatigue, headache or abrupt weight changes.  HEENT: Patient reports ringing in the ears.  Denies eye pain, eye redness, ear pain, wax buildup, runny nose, nasal congestion, bloody nose, or sore throat. Respiratory: Denies difficulty breathing, shortness of breath, cough or sputum production.   Cardiovascular: Denies chest pain, chest tightness, palpitations or swelling in the hands or feet.  Gastrointestinal: Denies abdominal pain, bloating, constipation, diarrhea or blood in the stool.  GU: Denies urgency, frequency, pain with urination, burning sensation, blood in urine, odor or discharge. Musculoskeletal: Denies decrease in range of motion, difficulty with gait, muscle pain or joint pain and swelling.  Skin: Denies redness, rashes, lesions or ulcercations.  Neurological: Pt reports insomnia, hot flashes and night sweats. Denies dizziness, difficulty with memory, difficulty with speech or problems with balance and coordination.  Psych: Denies anxiety, depression, SI/HI.  No other specific complaints in a complete review of systems (except as listed in HPI above).  Objective:    Physical Exam  BP 130/84 (BP Location: Left Arm, Patient Position: Sitting, Cuff Size: Normal)   Ht 5' 6 (1.676 m)   Wt 184 lb 3.2 oz (83.6 kg)   LMP 06/22/2024 (Approximate)   BMI 29.73 kg/m    Wt Readings from Last 3 Encounters:  04/17/24 184 lb (83.5 kg)  11/24/23 167 lb 9.6 oz (76 kg)  05/21/23 181 lb (82.1 kg)    General: Appears her stated age, overweight, in NAD. Skin: Warm, dry and intact.  HEENT: Head: normal shape and size; Eyes: sclera white, no icterus, conjunctiva pink, PERRLA and EOMs intact;  Neck:  Neck supple, trachea midline. No masses, lumps or thyromegaly present.  Cardiovascular: Normal rate and rhythm. S1,S2 noted.  No murmur, rubs or gallops noted. No JVD or  BLE edema. No carotid bruits noted. Pulmonary/Chest: Normal effort and positive vesicular breath sounds. No respiratory distress. No wheezes, rales or ronchi noted.  Abdomen: Normal bowel sounds.  Musculoskeletal: Strength 5/5 BUE/BLE. No difficulty with gait.  Neurological: Alert and oriented. Cranial nerves II-XII grossly intact. Coordination normal.  Psychiatric: Mood and affect normal. Behavior is normal. Judgment and thought content normal.     BMET    Component Value Date/Time   NA 135 11/24/2023 0836   NA 140 06/04/2023 1415   K 4.4 11/24/2023 0836   CL 105 11/24/2023 0836   CO2 22 11/24/2023 0836   GLUCOSE 94 11/24/2023 0836   BUN 18 11/24/2023 0836   BUN 12 06/04/2023 1415   CREATININE 0.76 11/24/2023 0836   CALCIUM 9.0 11/24/2023 0836    Lipid Panel     Component Value Date/Time   CHOL 181 11/24/2023 0836   CHOL 180 06/04/2023 1415   TRIG 81 11/24/2023 0836   HDL 42 (L) 11/24/2023 0836   HDL 47 06/04/2023 1415   CHOLHDL 4.3 11/24/2023 0836   LDLCALC 121 (H) 11/24/2023 0836    CBC    Component Value Date/Time   WBC 5.7 11/24/2023 0836   RBC 4.37 11/24/2023 0836   HGB 13.8 11/24/2023 0836   HGB 13.2 06/04/2023 1415   HCT 41.0 11/24/2023 0836   HCT 40.8 06/04/2023 1415    PLT 253 11/24/2023 0836   PLT 306 06/04/2023 1415   MCV 93.8 11/24/2023 0836   MCV 88 06/04/2023 1415   MCH 31.6 11/24/2023 0836   MCHC 33.7 11/24/2023 0836   RDW 11.4 11/24/2023 0836   RDW 12.4 06/04/2023 1415   LYMPHSABS 2.6 06/20/2021 1630   EOSABS 0.1 06/20/2021 1630   BASOSABS 0.0 06/20/2021 1630    Hgb A1C Lab Results  Component Value Date   HGBA1C 5.5 11/24/2023            Assessment & Plan:   Preventative Health Maintenance:  Encouraged her to get a flu shot in the fall Tetanus UTD Encouraged her to get her COVID-vaccine Discussed Shingrix vaccine, she will check coverage with her insurance company and schedule visit she would like to have this done Pap smear UTD  Mammogram UTD Colon screening UTD Encouraged her to consume a balanced diet and exercise regimen Advised her to see an eye doctor and dentist annually We will check CBC, c-Met, lipid, and A1c today  RTC in 6 months, follow-up chronic conditions Angeline Laura, NP

## 2024-07-21 NOTE — Patient Instructions (Signed)

## 2024-07-21 NOTE — Assessment & Plan Note (Signed)
 Encourage diet and exercise for weight loss

## 2024-07-24 ENCOUNTER — Ambulatory Visit: Payer: Self-pay | Admitting: Internal Medicine

## 2024-07-27 MED ORDER — SIMVASTATIN 20 MG PO TABS: 20.0000 mg | ORAL_TABLET | Freq: Every day | ORAL | 1 refills | Status: DC

## 2024-07-28 ENCOUNTER — Other Ambulatory Visit: Payer: Self-pay

## 2024-07-28 DIAGNOSIS — N951 Menopausal and female climacteric states: Secondary | ICD-10-CM

## 2024-07-28 MED ORDER — ESTRADIOL 0.05 MG/24HR TD PTWK: 0.0500 mg | MEDICATED_PATCH | TRANSDERMAL | 3 refills | Status: AC

## 2024-09-03 ENCOUNTER — Telehealth: Admitting: Family Medicine

## 2024-09-03 DIAGNOSIS — R399 Unspecified symptoms and signs involving the genitourinary system: Secondary | ICD-10-CM

## 2024-09-03 MED ORDER — NITROFURANTOIN MONOHYD MACRO 100 MG PO CAPS: 100.0000 mg | ORAL_CAPSULE | Freq: Two times a day (BID) | ORAL | 0 refills | Status: AC

## 2024-09-03 NOTE — Progress Notes (Signed)

## 2024-10-29 ENCOUNTER — Other Ambulatory Visit: Payer: Self-pay | Admitting: Internal Medicine

## 2024-10-30 NOTE — Telephone Encounter (Signed)
 Requested Prescriptions  Pending Prescriptions Disp Refills   simvastatin  (ZOCOR ) 20 MG tablet [Pharmacy Med Name: Simvastatin  20 MG Oral Tablet] 90 tablet 3    Sig: TAKE 1 TABLET BY MOUTH AT  BEDTIME     Cardiovascular:  Antilipid - Statins Failed - 10/30/2024  5:27 PM      Failed - Lipid Panel in normal range within the last 12 months    Cholesterol, Total  Date Value Ref Range Status  06/04/2023 180 100 - 199 mg/dL Final   Cholesterol  Date Value Ref Range Status  07/21/2024 210 (H) <200 mg/dL Final   LDL Cholesterol (Calc)  Date Value Ref Range Status  07/21/2024 143 (H) mg/dL (calc) Final    Comment:    Reference range: <100 . Desirable range <100 mg/dL for primary prevention;   <70 mg/dL for patients with CHD or diabetic patients  with > or = 2 CHD risk factors. SABRA LDL-C is now calculated using the Martin-Hopkins  calculation, which is a validated novel method providing  better accuracy than the Friedewald equation in the  estimation of LDL-C.  Gladis APPLETHWAITE et al. SANDREA. 7986;689(80): 2061-2068  (http://education.[facility].com/faq/FAQ164)    HDL  Date Value Ref Range Status  07/21/2024 41 (L) > OR = 50 mg/dL Final  93/85/7975 47 >60 mg/dL Final   Triglycerides  Date Value Ref Range Status  07/21/2024 139 <150 mg/dL Final         Passed - Patient is not pregnant      Passed - Valid encounter within last 12 months    Recent Outpatient Visits           3 months ago Encounter for general adult medical examination with abnormal findings   [place] [facility] [place], Angeline ORN, NP

## 2024-12-19 ENCOUNTER — Other Ambulatory Visit: Payer: Self-pay | Admitting: Internal Medicine

## 2024-12-21 NOTE — Telephone Encounter (Signed)
 Requested Prescriptions  Pending Prescriptions Disp Refills   simvastatin  (ZOCOR ) 20 MG tablet [Pharmacy Med Name: Simvastatin  20 MG Oral Tablet] 90 tablet 2    Sig: TAKE 1 TABLET BY MOUTH AT  BEDTIME     Cardiovascular:  Antilipid - Statins Failed - 12/21/2024 10:30 AM      Failed - Lipid Panel in normal range within the last 12 months    Cholesterol, Total  Date Value Ref Range Status  06/04/2023 180 100 - 199 mg/dL Final   Cholesterol  Date Value Ref Range Status  07/21/2024 210 (H) <200 mg/dL Final   LDL Cholesterol (Calc)  Date Value Ref Range Status  07/21/2024 143 (H) mg/dL (calc) Final    Comment:    Reference range: <100 . Desirable range <100 mg/dL for primary prevention;   <70 mg/dL for patients with CHD or diabetic patients  with > or = 2 CHD risk factors. SABRA LDL-C is now calculated using the Martin-Hopkins  calculation, which is a validated novel method providing  better accuracy than the Friedewald equation in the  estimation of LDL-C.  Gladis APPLETHWAITE et al. SANDREA. 7986;689(80): 2061-2068  (http://education.[facility].com/faq/FAQ164)    HDL  Date Value Ref Range Status  07/21/2024 41 (L) > OR = 50 mg/dL Final  93/85/7975 47 >60 mg/dL Final   Triglycerides  Date Value Ref Range Status  07/21/2024 139 <150 mg/dL Final         Passed - Patient is not pregnant      Passed - Valid encounter within last 12 months    Recent Outpatient Visits           5 months ago Encounter for general adult medical examination with abnormal findings   [place] [facility] [place], Angeline ORN, NP

## 2024-12-23 ENCOUNTER — Telehealth: Admitting: Family Medicine

## 2024-12-23 DIAGNOSIS — N3000 Acute cystitis without hematuria: Secondary | ICD-10-CM | POA: Diagnosis not present

## 2024-12-23 MED ORDER — NITROFURANTOIN MONOHYD MACRO 100 MG PO CAPS: 100.0000 mg | ORAL_CAPSULE | Freq: Two times a day (BID) | ORAL | 0 refills | Status: AC

## 2024-12-23 NOTE — Progress Notes (Signed)

## 2024-12-26 ENCOUNTER — Other Ambulatory Visit: Payer: Self-pay | Admitting: Internal Medicine

## 2024-12-26 DIAGNOSIS — Z1231 Encounter for screening mammogram for malignant neoplasm of breast: Secondary | ICD-10-CM

## 2025-01-22 ENCOUNTER — Ambulatory Visit: Admitting: Internal Medicine

## 2025-01-23 ENCOUNTER — Other Ambulatory Visit: Payer: Self-pay | Admitting: Internal Medicine

## 2025-01-23 DIAGNOSIS — Z0001 Encounter for general adult medical examination with abnormal findings: Secondary | ICD-10-CM

## 2025-01-24 ENCOUNTER — Encounter: Payer: Self-pay | Admitting: Internal Medicine

## 2025-01-24 ENCOUNTER — Ambulatory Visit: Admitting: Internal Medicine

## 2025-01-24 VITALS — BP 126/84 | Ht 66.0 in | Wt 192.0 lb

## 2025-01-24 DIAGNOSIS — K219 Gastro-esophageal reflux disease without esophagitis: Secondary | ICD-10-CM

## 2025-01-24 DIAGNOSIS — N951 Menopausal and female climacteric states: Secondary | ICD-10-CM | POA: Diagnosis not present

## 2025-01-24 DIAGNOSIS — H409 Unspecified glaucoma: Secondary | ICD-10-CM

## 2025-01-24 DIAGNOSIS — E6609 Other obesity due to excess calories: Secondary | ICD-10-CM

## 2025-01-24 DIAGNOSIS — E782 Mixed hyperlipidemia: Secondary | ICD-10-CM

## 2025-01-24 DIAGNOSIS — E0590 Thyrotoxicosis, unspecified without thyrotoxic crisis or storm: Secondary | ICD-10-CM

## 2025-01-24 DIAGNOSIS — I10 Essential (primary) hypertension: Secondary | ICD-10-CM | POA: Diagnosis not present

## 2025-01-24 DIAGNOSIS — Z0001 Encounter for general adult medical examination with abnormal findings: Secondary | ICD-10-CM | POA: Diagnosis not present

## 2025-01-24 DIAGNOSIS — E66811 Obesity, class 1: Secondary | ICD-10-CM | POA: Diagnosis not present

## 2025-01-24 DIAGNOSIS — R7303 Prediabetes: Secondary | ICD-10-CM

## 2025-01-24 DIAGNOSIS — J4520 Mild intermittent asthma, uncomplicated: Secondary | ICD-10-CM | POA: Diagnosis not present

## 2025-01-24 DIAGNOSIS — Z6830 Body mass index (BMI) 30.0-30.9, adult: Secondary | ICD-10-CM

## 2025-01-24 LAB — T4, FREE: Free T4: 1.2 ng/dL (ref 0.8–1.8)

## 2025-01-24 LAB — COMPREHENSIVE METABOLIC PANEL WITH GFR
AG Ratio: 1.7 (calc) (ref 1.0–2.5)
ALT: 19 U/L (ref 6–29)
AST: 14 U/L (ref 10–35)
Albumin: 4.4 g/dL (ref 3.6–5.1)
Alkaline phosphatase (APISO): 41 U/L (ref 37–153)
BUN: 15 mg/dL (ref 7–25)
CO2: 21 mmol/L (ref 20–32)
Calcium: 8.9 mg/dL (ref 8.6–10.4)
Chloride: 105 mmol/L (ref 98–110)
Creat: 0.6 mg/dL (ref 0.50–1.03)
Globulin: 2.6 g/dL (ref 1.9–3.7)
Glucose, Bld: 86 mg/dL (ref 65–139)
Potassium: 4 mmol/L (ref 3.5–5.3)
Sodium: 135 mmol/L (ref 135–146)
Total Bilirubin: 0.3 mg/dL (ref 0.2–1.2)
Total Protein: 7 g/dL (ref 6.1–8.1)
eGFR: 107 mL/min/{1.73_m2}

## 2025-01-24 LAB — LIPID PANEL
Cholesterol: 161 mg/dL
HDL: 38 mg/dL — ABNORMAL LOW
LDL Cholesterol (Calc): 104 mg/dL — ABNORMAL HIGH
Non-HDL Cholesterol (Calc): 123 mg/dL
Total CHOL/HDL Ratio: 4.2 (calc)
Triglycerides: 91 mg/dL

## 2025-01-24 LAB — CBC
HCT: 39.8 % (ref 35.9–46.0)
Hemoglobin: 13.3 g/dL (ref 11.7–15.5)
MCH: 30.8 pg (ref 27.0–33.0)
MCHC: 33.4 g/dL (ref 31.6–35.4)
MCV: 92.1 fL (ref 81.4–101.7)
MPV: 10.5 fL (ref 7.5–12.5)
Platelets: 268 10*3/uL (ref 140–400)
RBC: 4.32 Million/uL (ref 3.80–5.10)
RDW: 11.9 % (ref 11.0–15.0)
WBC: 7.9 10*3/uL (ref 3.8–10.8)

## 2025-01-24 LAB — HEMOGLOBIN A1C
Hgb A1c MFr Bld: 5.6 %
Mean Plasma Glucose: 114 mg/dL
eAG (mmol/L): 6.3 mmol/L

## 2025-01-24 LAB — TSH: TSH: 1.33 m[IU]/L

## 2025-01-24 MED ORDER — OLMESARTAN MEDOXOMIL 20 MG PO TABS: 20.0000 mg | ORAL_TABLET | Freq: Every day | ORAL | 1 refills | Status: AC

## 2025-01-24 NOTE — Assessment & Plan Note (Signed)
 Complicated by obesity A1c today Encourage low-carb diet and exercise for weight loss

## 2025-01-24 NOTE — Assessment & Plan Note (Signed)
 TSH and free T4 today She will continue to follow with endocrinology

## 2025-01-24 NOTE — Assessment & Plan Note (Signed)
 Continue Simbrinza 1-0.2% suspension twice daily She will continue to follow with ophthalmology

## 2025-01-24 NOTE — Assessment & Plan Note (Signed)
 Complicated by obesity C-Met and lipid profile today Not currently taking simvastatin  20 mg daily Encourage her to consume a low-fat diet

## 2025-01-24 NOTE — Assessment & Plan Note (Signed)
 Complicated by obesity Currently not an issue We will monitor

## 2025-01-24 NOTE — Assessment & Plan Note (Signed)
 Complicated by obesity Controlled on olmesartan  20 mg daily Reinforced DASH diet and exercise for weight loss C-Met today

## 2025-01-24 NOTE — Progress Notes (Signed)
 "  Subjective:    Patient ID: Lucille Witts, female    DOB: 05-15-1971, 54 y.o.   MRN: [id number]  HPI  Patient presents to clinic today for followup chronic conditions.  HTN: Her BP today is 126/84.  She is taking olmesartan  as prescribed.  ECG from 03/2023 reviewed.  Reactive airway disease: Managed with montelukast and albuterol , taken only as needed. There are no PFTs on file.  She follows with pulmonology.  GERD: Currently not an issue. She is not taking any medication for this. There is no upper GI on file.  Prediabetes: Her last A1c was 5.7%, 07/2024.  She is not taking any oral diabetic medication at this time.  She does not check her sugars.  Glaucoma: Managed with simbrinza, prescribed by ophthalmology.  HLD: Her last LDL was 143, triglycerides 139, 07/2024. She is not taking simvastatin  as prescribed.  She tries to consume low-fat diet.  Hyperthyroidism: She is not currently taking any thyroid  medication at this time but has been on methimazole in the past.  She follows with endocrinology.  Perimenopause: She reports irritability, hot flashes, night sweats, weight gain. Managed with estradiol  patch and progesterone. She follows with GYN.  Review of Systems     Past Medical History:  Diagnosis Date   Allergy As a child   Yearly Exams with [facility]   Asthma    BRCA negative 2012, 2/19   BRCA neg 2012; MyRIsk neg 2019   Depression    Family history of breast cancer    Family history of ovarian cancer    6/12 BRCA Neg   Glaucoma    Hypertension    Increased risk of breast cancer 01/2018   IBIS=21%/riskscore=34.5%   Plantar fasciitis     Current Outpatient Medications  Medication Sig Dispense Refill   albuterol  (VENTOLIN  HFA) 108 (90 Base) MCG/ACT inhaler TAKE 2 PUFFS BY MOUTH EVERY 6 HOURS AS NEEDED FOR WHEEZE OR SHORTNESS OF BREATH (Patient taking differently: Inhale 2 puffs into the lungs as needed for shortness of breath.) 8.5 each 1   cetirizine  (ZYRTEC) 10 MG tablet Take 1 tablet by mouth daily.     Cholecalciferol (VITAMIN D -3) 5000 units TABS Take by mouth.     estradiol  (CLIMARA  - DOSED IN MG/24 HR) 0.05 mg/24hr patch Place 1 patch (0.05 mg total) onto the skin once a week. 12 patch 3   montelukast (SINGULAIR) 10 MG tablet Take 10 mg by mouth at bedtime.     norethindrone  (AYGESTIN ) 5 MG tablet Take 1 tablet (5 mg total) by mouth daily. 90 tablet 3   olmesartan  (BENICAR ) 20 MG tablet Take 1 tablet (20 mg total) by mouth daily. 90 tablet 1   SIMBRINZA 1-0.2 % SUSP Apply 1 drop to eye 2 (two) times daily.     simvastatin  (ZOCOR ) 20 MG tablet TAKE 1 TABLET BY MOUTH AT  BEDTIME 90 tablet 1   No current facility-administered medications for this visit.    Allergies  Allergen Reactions   Iodinated Contrast Media Hives   Other Hives    Seasonal, cats, dogs   Penicillins Hives    Family History  Problem Relation Age of Onset   Breast cancer Mother 8   Asthma Mother    Diabetes Mother    Heart attack Mother    Cancer Mother    Early death Mother    Bladder Cancer Father    Hyperlipidemia Father    Hypertension Father    Asthma Brother  Depression Brother    Hypertension Brother    Learning disabilities Brother    Mental illness Brother    Cancer Brother    Early death Brother    Intellectual disability Brother    Alzheimer's disease Maternal Grandmother    Stomach cancer Paternal Grandmother    Cancer Paternal Grandmother    Breast cancer Paternal Aunt 87   Ovarian cancer Paternal Aunt 79   Colon cancer Paternal Uncle 26   Cancer Paternal Uncle    Cancer Maternal Aunt     Social History   Socioeconomic History   Marital status: Married    Spouse name: Not on file   Number of children: Not on file   Years of education: Not on file   Highest education level: Some college, no degree  Occupational History   Not on file  Tobacco Use   Smoking status: Never   Smokeless tobacco: Never  Vaping Use    Vaping status: Never Used  Substance and Sexual Activity   Alcohol use: No   Drug use: No   Sexual activity: Yes    Birth control/protection: Pill  Other Topics Concern   Not on file  Social History Narrative   Works at [employer], teacher, music.     Social Drivers of Health   Tobacco Use: Low Risk (07/21/2024)   Patient History    Smoking Tobacco Use: Never    Smokeless Tobacco Use: Never    Passive Exposure: Not on file  Financial Resource Strain: Low Risk (07/21/2024)   Overall Financial Resource Strain (CARDIA)    Difficulty of Paying Living Expenses: Not hard at all  Food Insecurity: No Food Insecurity (07/21/2024)   Epic    Worried About Programme Researcher, Broadcasting/film/video in the Last Year: Never true    Ran Out of Food in the Last Year: Never true  Transportation Needs: No Transportation Needs (07/21/2024)   Epic    Lack of Transportation (Medical): No    Lack of Transportation (Non-Medical): No  Physical Activity: Insufficiently Active (07/21/2024)   Exercise Vital Sign    Days of Exercise per Week: 2 days    Minutes of Exercise per Session: 30 min  Stress: Stress Concern Present (07/21/2024)   [employer] of Occupational Health - Occupational Stress Questionnaire    Feeling of Stress: To some extent  Social Connections: Socially Integrated (07/21/2024)   Social Connection and Isolation Panel    Frequency of Communication with Friends and Family: Twice a week    Frequency of Social Gatherings with Friends and Family: Once a week    Attends Religious Services: More than 4 times per year    Active Member of [employer] or Organizations: Yes    Attends Banker Meetings: More than 4 times per year    Marital Status: Married  Catering Manager Violence: Patient Declined (11/24/2023)   Humiliation, Afraid, Rape, and Kick questionnaire    Fear of Current or Ex-Partner: Patient declined    Emotionally Abused: Patient declined    Physically Abused: Patient declined    Sexually Abused:  Patient declined  Depression (PHQ2-9): Low Risk (07/21/2024)   Depression (PHQ2-9)    PHQ-2 Score: 4  Alcohol Screen: Low Risk (11/06/2022)   Alcohol Screen    Last Alcohol Screening Score (AUDIT): 0  Housing: Low Risk (07/21/2024)   Epic    Unable to Pay for Housing in the Last Year: No    Number of Times Moved in the Last Year: 0  Homeless in the Last Year: No  Utilities: Not At Risk (07/20/2023)   Received from [employer] Utilities    Threatened with loss of utilities: No  Health Literacy: Patient Declined (11/24/2023)   B1300 Health Literacy    Frequency of need for help with medical instructions: Patient declines to respond     Constitutional: Denies fever, malaise, fatigue, headache or abrupt weight changes.  HEENT: Denies eye pain, eye redness, ear pain, wax buildup, runny nose, nasal congestion, bloody nose, or sore throat. Respiratory: Denies difficulty breathing, shortness of breath, cough or sputum production.   Cardiovascular: Denies chest pain, chest tightness, palpitations or swelling in the hands or feet.  Gastrointestinal: Denies abdominal pain, bloating, constipation, diarrhea or blood in the stool.  GU: Pt reports urinary frequency. Denies urgency, pain with urination, burning sensation, blood in urine, odor or discharge. Musculoskeletal: Denies decrease in range of motion, difficulty with gait, muscle pain or joint pain and swelling.  Skin: Denies redness, rashes, lesions or ulcercations.  Neurological: Pt reports hot flashes, night sweats, insomnia. Denies dizziness, difficulty with memory, difficulty with speech or problems with balance and coordination.  Psych: Pt reports irritability. Denies anxiety, depression, SI/HI.  No other specific complaints in a complete review of systems (except as listed in HPI above).  Objective:   Physical Exam BP 126/84 (BP Location: Left Arm, Patient Position: Sitting, Cuff Size: Normal)   Ht 5' 6 (1.676  m)   Wt 192 lb (87.1 kg)   LMP 01/21/2025 (Approximate)   BMI 30.99 kg/m     Wt Readings from Last 3 Encounters:  07/21/24 184 lb 3.2 oz (83.6 kg)  04/17/24 184 lb (83.5 kg)  11/24/23 167 lb 9.6 oz (76 kg)    General: Appears her stated age, obese, in NAD. Skin: Warm, dry and intact.  HEENT: Head: normal shape and size; Eyes: sclera white, no icterus, conjunctiva pink, PERRLA and EOMs intact;  Neck:  Neck supple, trachea midline. No masses, lumps or thyromegaly present.  Cardiovascular: Normal rate and rhythm.  No murmurs, rubs or gallops noted.  No carotid bruits noted. Pulmonary/Chest: Normal effort and positive vesicular breath sounds.  Musculoskeletal: No difficulty with gait.  Neurological: Alert and oriented.   Psychiatric: Mood and affect normal. Behavior is normal. Judgment and thought content normal.    BMET    Component Value Date/Time   NA 136 07/21/2024 0826   NA 140 06/04/2023 1415   K 4.3 07/21/2024 0826   CL 105 07/21/2024 0826   CO2 22 07/21/2024 0826   GLUCOSE 90 07/21/2024 0826   BUN 12 07/21/2024 0826   BUN 12 06/04/2023 1415   CREATININE 0.70 07/21/2024 0826   CALCIUM 9.1 07/21/2024 0826    Lipid Panel     Component Value Date/Time   CHOL 210 (H) 07/21/2024 0826   CHOL 180 06/04/2023 1415   TRIG 139 07/21/2024 0826   HDL 41 (L) 07/21/2024 0826   HDL 47 06/04/2023 1415   CHOLHDL 5.1 (H) 07/21/2024 0826   LDLCALC 143 (H) 07/21/2024 0826    CBC    Component Value Date/Time   WBC 6.7 07/21/2024 0826   RBC 4.54 07/21/2024 0826   HGB 14.3 07/21/2024 0826   HGB 13.2 06/04/2023 1415   HCT 43.2 07/21/2024 0826   HCT 40.8 06/04/2023 1415   PLT 243 07/21/2024 0826   PLT 306 06/04/2023 1415   MCV 95.2 07/21/2024 0826   MCV 88 06/04/2023 1415  MCH 31.5 07/21/2024 0826   MCHC 33.1 07/21/2024 0826   RDW 11.8 07/21/2024 0826   RDW 12.4 06/04/2023 1415   LYMPHSABS 2.6 06/20/2021 1630   EOSABS 0.1 06/20/2021 1630   BASOSABS 0.0 06/20/2021 1630     Hgb A1C Lab Results  Component Value Date   HGBA1C 5.7 (H) 07/21/2024           Assessment & Plan:      RTC in 6 months for your annual exam Angeline Laura, NP  "

## 2025-01-24 NOTE — Patient Instructions (Signed)
 Perimenopause: What to Know Perimenopause is the time in your life when your levels of estrogen start to go down. Estrogen is the female hormone made by your ovaries. Perimenopause can start 2-8 years before menopause. It can cause changes to your menstrual period. During this time, your ovaries may or may not make an egg. In many cases, you can still get pregnant. What are the causes? Perimenopause is a natural change in your homone levels that happens as you get older. What increases the risk? You're more likely to start perimenopause early if: You have an abnormal growth (tumor) of the pituitary gland in your brain. You have a disease that affects your ovaries. You've had certain treatments for cancer. These include: Chemotherapy. Hormone therapy. Radiation therapy on the area between your hips (pelvis). You smoke a lot or drink a lot of alcohol. Other family members have gone through menopause early. What are the signs or symptoms? Symptoms are unique to each person. You may have: Hot flashes. Irregular periods. Night sweats. Changes in how you feel about sex. You may have less of a sex drive or feel more discomfort around your sexuality. Vaginal dryness. Headaches. Mood swings. Other symptoms may include: Depression. This is when you feel sad or hopeless. Trouble sleeping. Memory problems or trouble focusing. Irritability. This means getting annoyed easily. Tiredness. Weight gain. Anxiety. This is feeling worried or nervous. You can also have trouble getting pregnant. How is this diagnosed? You may be diagnosed based on: Your medical history. An exam. Your age. Your history of menstrual periods. Your symptoms. Hormone tests. How is this treated? In some cases, no treatment is needed. Talk with your health care provider about if you should get treated. Treatments may include: Menopausal hormone therapy (MHT). Medicines to treat certain  symptoms. Acupuncture. Vitamin or herbal supplements. Before you start treatment, let your provider know if you or anyone in your family has or has had: Heart disease. Breast cancer. Blood clots. Diabetes. Osteoporosis. Follow these instructions at home: Eating and drinking  Eat a balanced diet. It should include: Fresh fruits and vegetables. Whole grains. Soybeans. Eggs. Lean meat. Low-fat dairy. To help prevent hot flashes, stay away from: Alcohol. Drinks with caffeine in them. Spicy foods. Lifestyle Do not smoke, vape, or use nicotine or tobacco. Get at least 30 minutes of physical activity on 5 or more days each week. Get 7-8 hours of sleep each night. Dress in layers that can be taken off if you have a hot flash. Find ways to manage stress. You may want to try: Deep breathing. Meditation. Writing in a journal. General instructions  Take your medicines only as told. Keep track of your periods. Track: When they happen. How heavy they are. How long they last. How much time passes between periods. Keep track of your symptoms. Track: When they start. How often you have them. How long they last. Use vaginal lubricants or moisturizers. These can help with: Vaginal dryness. Comfort during sex. You can still get pregnant if you're having any periods. Make sure you use birth control if you don't want to get pregnant. Contact a health care provider if: You have a very heavy period or pass blood clots. Your period lasts more than 2 days longer than normal. Your period comes back sooner than 21 days. You bleed after having sex. You have pain during sex. You have pain when you pee. You get very bad headaches. You have trouble with your eyesight. Get help right away if: You  have chest pain. You have trouble breathing. You have trouble talking. You have very bad depression. This information is not intended to replace advice given to you by your health care provider.  Make sure you discuss any questions you have with your health care provider. Document Revised: 08/12/2023 Document Reviewed: 08/12/2023 [employer]vier Patient Education  2024 [employer].

## 2025-01-24 NOTE — Assessment & Plan Note (Signed)
 Continue estradiol  0.05 mg weekly patch and norethindrone  mg daily She will continue to follow with GYN

## 2025-01-24 NOTE — Assessment & Plan Note (Signed)
 Encourage diet and exercise for weight loss Recently started on semaglutide 0.25 mg weekly

## 2025-01-24 NOTE — Assessment & Plan Note (Signed)
 Continue montelukast 10 mg daily and albuterol  108 mcg/act as needed She will continue to follow with pulmonology

## 2025-01-25 ENCOUNTER — Ambulatory Visit: Payer: Self-pay | Admitting: Internal Medicine

## 2025-02-01 ENCOUNTER — Encounter

## 2025-07-30 ENCOUNTER — Encounter: Admitting: Internal Medicine
